# Patient Record
Sex: Female | Born: 1990 | Race: Black or African American | Hispanic: No | Marital: Married | Smoking: Never smoker
Health system: Southern US, Community
[De-identification: ages and names within clinical notes are randomized; demographics above are authoritative.]

## PROBLEM LIST (undated history)

## (undated) ENCOUNTER — Inpatient Hospital Stay (HOSPITAL_COMMUNITY): Payer: Self-pay

## (undated) DIAGNOSIS — D219 Benign neoplasm of connective and other soft tissue, unspecified: Secondary | ICD-10-CM

## (undated) HISTORY — DX: Benign neoplasm of connective and other soft tissue, unspecified: D21.9

## (undated) HISTORY — PX: NO PAST SURGERIES: SHX2092

---

## 2010-09-28 ENCOUNTER — Other Ambulatory Visit: Payer: Self-pay | Admitting: Internal Medicine

## 2010-09-28 ENCOUNTER — Ambulatory Visit
Admission: RE | Admit: 2010-09-28 | Discharge: 2010-09-28 | Disposition: A | Discharge status: Home / Self Care | Payer: No Typology Code available for payment source | Source: Ambulatory Visit | Attending: Internal Medicine | Admitting: Internal Medicine

## 2010-09-28 DIAGNOSIS — R7611 Nonspecific reaction to tuberculin skin test without active tuberculosis: Secondary | ICD-10-CM

## 2012-08-13 ENCOUNTER — Emergency Department (HOSPITAL_COMMUNITY)
Admission: EM | Admit: 2012-08-13 | Discharge: 2012-08-13 | Disposition: A | Discharge status: Home / Self Care | Payer: BC Managed Care – PPO | Attending: Emergency Medicine | Admitting: Emergency Medicine

## 2012-08-13 ENCOUNTER — Encounter (HOSPITAL_COMMUNITY): Payer: Self-pay | Admitting: Emergency Medicine

## 2012-08-13 DIAGNOSIS — R1013 Epigastric pain: Secondary | ICD-10-CM | POA: Insufficient documentation

## 2012-08-13 DIAGNOSIS — Z3202 Encounter for pregnancy test, result negative: Secondary | ICD-10-CM | POA: Insufficient documentation

## 2012-08-13 DIAGNOSIS — R109 Unspecified abdominal pain: Secondary | ICD-10-CM

## 2012-08-13 LAB — CBC WITH DIFFERENTIAL/PLATELET
Eosinophils Relative: 2 % (ref 0–5)
HCT: 34.6 % — ABNORMAL LOW (ref 36.0–46.0)
Hemoglobin: 11 g/dL — ABNORMAL LOW (ref 12.0–15.0)
Lymphocytes Relative: 35 % (ref 12–46)
Lymphs Abs: 3.4 10*3/uL (ref 0.7–4.0)
MCV: 72.8 fL — ABNORMAL LOW (ref 78.0–100.0)
Monocytes Absolute: 0.7 10*3/uL (ref 0.1–1.0)
RBC: 4.75 MIL/uL (ref 3.87–5.11)
WBC: 9.6 10*3/uL (ref 4.0–10.5)

## 2012-08-13 LAB — URINALYSIS, ROUTINE W REFLEX MICROSCOPIC
Glucose, UA: NEGATIVE mg/dL
Hgb urine dipstick: NEGATIVE
Protein, ur: NEGATIVE mg/dL
pH: 7.5 (ref 5.0–8.0)

## 2012-08-13 LAB — BASIC METABOLIC PANEL
CO2: 26 mEq/L (ref 19–32)
Calcium: 9.3 mg/dL (ref 8.4–10.5)
Creatinine, Ser: 0.79 mg/dL (ref 0.50–1.10)
Glucose, Bld: 87 mg/dL (ref 70–99)

## 2012-08-13 LAB — PREGNANCY, URINE: Preg Test, Ur: NEGATIVE

## 2012-08-13 MED ORDER — SODIUM CHLORIDE 0.9 % IV BOLUS (SEPSIS)
1000.0000 mL | Freq: Once | INTRAVENOUS | Status: AC
  Administered 2012-08-13: 1000 mL via INTRAVENOUS

## 2012-08-13 MED ORDER — FAMOTIDINE 20 MG PO TABS: 20.0000 mg | ORAL_TABLET | Freq: Two times a day (BID) | ORAL | Status: DC

## 2012-08-13 MED ORDER — GI COCKTAIL ~~LOC~~
30.0000 mL | Freq: Once | ORAL | Status: AC
  Administered 2012-08-13: 30 mL via ORAL
  Filled 2012-08-13: qty 30

## 2012-08-13 NOTE — ED Provider Notes (Signed)
History     CSN: [id number]  Arrival date & time 08/13/12  4098   First MD Initiated Contact with Patient 08/13/12 [phone]      Chief Complaint  Patient presents with  . Abdominal Pain    (Consider location/radiation/quality/duration/timing/severity/associated sxs/prior treatment) HPI Comments: Patient is a 22 year old female who presents today with epigastric abdominal pain that she describes as pressure and hard. The pain began after awakening after a nap yesterday around 4 PM. Around 3 PM she took diet pills. She states that the pain is not necessarily worsening, but it is not improving. She denies nausea, vomiting, diarrhea, constipation, fevers, sick contacts, suspicious food intake, recent travel. Her last bowel movement was last night. He has not had anything to make the pain better, although sometimes holding her stomach makes it feel better. The pain is nonradiating. No fevers, chills, shortness of breath, weakness, numbness. The history is provided by the patient. No language interpreter was used.    History reviewed. No pertinent past medical history.  History reviewed. No pertinent past surgical history.  History reviewed. No pertinent family history.  History  Substance Use Topics  . Smoking status: Never Smoker   . Smokeless tobacco: Not on file  . Alcohol Use: No    OB History   Grav Para Term Preterm Abortions TAB SAB Ect Mult Living                  Review of Systems  Constitutional: Negative for fever and chills.  Respiratory: Negative for shortness of breath.   Gastrointestinal: Positive for abdominal pain. Negative for nausea, vomiting and constipation.  All other systems reviewed and are negative.    Allergies  Review of patient's allergies indicates no known allergies.  Home Medications   Current Outpatient Rx  Name  Route  Sig  Dispense  Refill  . ibuprofen (ADVIL,MOTRIN) 200 MG tablet   Oral   Take 200 mg by mouth every 6 (six) hours as  needed for pain (pain  headaches).           BP 117/65  Pulse 69  Temp(Src) 98.4 F (36.9 C) (Oral)  Resp 19  SpO2 100%  LMP 08/06/2012  Physical Exam  Nursing note and vitals reviewed. Constitutional: She is oriented to person, place, and time. She appears well-developed and well-nourished. No distress.  HENT:  Head: Normocephalic and atraumatic.  Right Ear: External ear normal.  Left Ear: External ear normal.  Nose: Nose normal.  Mouth/Throat: Oropharynx is clear and moist.  Eyes: Conjunctivae are normal.  Neck: Normal range of motion.  Cardiovascular: Normal rate, regular rhythm and normal heart sounds.   Pulmonary/Chest: Effort normal and breath sounds normal. No stridor. No respiratory distress. She has no wheezes. She has no rales.  Abdominal: Soft. Normal appearance and bowel sounds are normal. She exhibits no distension. There is tenderness in the epigastric area. There is no rebound and no guarding.  Musculoskeletal: Normal range of motion.  Neurological: She is alert and oriented to person, place, and time. She has normal strength.  Skin: Skin is warm and dry. She is not diaphoretic. No erythema.  Psychiatric: She has a normal mood and affect. Her behavior is normal.    ED Course  Procedures (including critical care time)  Labs Reviewed  CBC WITH DIFFERENTIAL - Abnormal; Notable for the following:    Hemoglobin 11.0 (*)    HCT 34.6 (*)    MCV 72.8 (*)    MCH 23.2 (*)  All other components within normal limits  BASIC METABOLIC PANEL - Abnormal; Notable for the following:    Sodium 134 (*)    All other components within normal limits  URINALYSIS, ROUTINE W REFLEX MICROSCOPIC  PREGNANCY, URINE   No results found.  0900: Patient reexamined, soft, nontender to palpation in abdomen. She states she is feeling significantly improved.   1. Abdominal pain       MDM  Patient presents with epigastric abdominal pain approximately 1 hour after taking an  expired diet pill. No rebound or guarding. Labs WNL. Symptoms improved after GI cocktail. Abdomen soft. Afebrile. No concern for cholecystitis, pancreatitis, appendicitis. Case was discussed with Dr. Adriana Simas. Return instructions given. Vital signs stable for discharge. Patient / Family / Caregiver informed of clinical course, understand medical decision-making process, and agree with plan.         Mora Bellman, PA-C 08/13/12 [phone]

## 2012-08-13 NOTE — ED Notes (Signed)
From home with complaint of abdominal pain at 8/ 10 , denies N/V. Last BM this morning.

## 2012-08-13 NOTE — ED Provider Notes (Signed)
Medical screening  examination/treatment/procedure(s) were conducted as a shared visit with non-physician practitioner(s) and myself.  I personally evaluated the patient during the encounter.  No acute abdomen  Donnetta Hutching, MD 08/13/12 1424

## 2012-08-31 ENCOUNTER — Encounter: Payer: Self-pay | Admitting: Physician Assistant

## 2012-11-30 ENCOUNTER — Encounter: Payer: Self-pay | Admitting: Physician Assistant

## 2012-12-14 ENCOUNTER — Ambulatory Visit (INDEPENDENT_AMBULATORY_CARE_PROVIDER_SITE_OTHER): Payer: BC Managed Care – PPO | Admitting: Emergency Medicine

## 2012-12-14 ENCOUNTER — Ambulatory Visit: Payer: BC Managed Care – PPO

## 2012-12-14 VITALS — BP 108/64 | HR 82 | Temp 98.9°F | Resp 20 | Ht 63.0 in | Wt 198.6 lb

## 2012-12-14 DIAGNOSIS — S139XXA Sprain of joints and ligaments of unspecified parts of neck, initial encounter: Secondary | ICD-10-CM

## 2012-12-14 DIAGNOSIS — M25529 Pain in unspecified elbow: Secondary | ICD-10-CM

## 2012-12-14 DIAGNOSIS — M25522 Pain in left elbow: Secondary | ICD-10-CM

## 2012-12-14 DIAGNOSIS — M542 Cervicalgia: Secondary | ICD-10-CM

## 2012-12-14 DIAGNOSIS — S335XXA Sprain of ligaments of lumbar spine, initial encounter: Secondary | ICD-10-CM

## 2012-12-14 MED ORDER — NAPROXEN SODIUM 550 MG PO TABS: 550.0000 mg | ORAL_TABLET | Freq: Two times a day (BID) | ORAL | Status: DC

## 2012-12-14 MED ORDER — CYCLOBENZAPRINE HCL 10 MG PO TABS: 10.0000 mg | ORAL_TABLET | Freq: Three times a day (TID) | ORAL | Status: DC | PRN

## 2012-12-14 NOTE — Patient Instructions (Addendum)
Lumbosacral Strain Lumbosacral strain is one of the most common causes of back pain. There are many causes of back pain. Most are not serious conditions. CAUSES  Your backbone (spinal column) is made up of 24 main vertebral bodies, the sacrum, and the coccyx. These are held together by muscles and tough, fibrous tissue (ligaments). Nerve roots pass through the openings between the vertebrae. A sudden move or injury to the back may cause injury to, or pressure on, these nerves. This may result in localized back pain or pain movement (radiation) into the buttocks, down the leg, and into the foot. Sharp, shooting pain from the buttock down the back of the leg (sciatica) is frequently associated with a ruptured (herniated) disk. Pain may be caused by muscle spasm alone. Your caregiver can often find the cause of your pain by the details of your symptoms and an exam. In some cases, you may need tests (such as X-rays). Your caregiver will work with you to decide if any tests are needed based on your specific exam. HOME CARE INSTRUCTIONS   Avoid an underactive lifestyle. Active exercise, as directed by your caregiver, is your greatest weapon against back pain.  Avoid hard physical activities (tennis, racquetball, waterskiing) if you are not in proper physical condition for it. This may aggravate or create problems.  If you have a back problem, avoid sports requiring sudden body movements. Swimming and walking are generally safer activities.  Maintain good posture.  Avoid becoming overweight (obese).  Use bed rest for only the most extreme, sudden (acute) episode. Your caregiver will help you determine how much bed rest is necessary.  For acute conditions, you may put ice on the injured area.  Put ice in a plastic bag.  Place a towel between your skin and the bag.  Leave the ice on for 15-20 minutes at a time, every 2 hours, or as needed.  After you are improved and more active, it may help to  apply heat for 30 minutes before activities. See your caregiver if you are having pain that lasts longer than expected. Your caregiver can advise appropriate exercises or therapy if needed. With conditioning, most back problems can be avoided. SEEK IMMEDIATE MEDICAL CARE IF:   You have numbness, tingling, weakness, or problems with the use of your arms or legs.  You experience severe back pain not relieved with medicines.  There is a change in bowel or bladder control.  You have increasing pain in any area of the body, including your belly (abdomen).  You notice shortness of breath, dizziness, or feel faint.  You feel sick to your stomach (nauseous), are throwing up (vomiting), or become sweaty.  You notice discoloration of your toes or legs, or your feet get very cold.  Your back pain is getting worse.  You have a fever. MAKE SURE YOU:   Understand these instructions.  Will watch your condition.  Will get help right away if you are not doing well or get worse. Document Released: 12/30/2004 Document Revised: 06/14/2011 Document Reviewed: 06/21/2008 [facility] Patient Information 2014 [place], [place]. Cervical Sprain A cervical sprain is an injury in the neck in which the ligaments are stretched or torn. The ligaments are the tissues that hold the bones of the neck (vertebrae) in place.Cervical sprains can range from very mild to very severe. Most cervical sprains get better in 1 to 3 weeks, but it depends on the cause and extent of the injury. Severe cervical sprains can cause the neck vertebrae  to be unstable. This can lead to damage of the spinal cord and can result in serious nervous system problems. Your caregiver will determine whether your cervical sprain is mild or severe. CAUSES  Severe cervical sprains may be caused by:  Contact sport injuries (football, rugby, wrestling, hockey, auto racing, gymnastics, diving, martial arts, boxing).  Motor vehicle  collisions.  Whiplash injuries. This means the neck is forcefully whipped backward and forward.  Falls. Mild cervical sprains may be caused by:   Awkward positions, such as cradling a telephone between your ear and shoulder.  Sitting in a chair that does not offer proper support.  Working at a poorly Marketing executive station.  Activities that require looking up or down for long periods of time. SYMPTOMS   Pain, soreness, stiffness, or a burning sensation in the front, back, or sides of the neck. This discomfort may develop immediately after injury or it may develop slowly and not begin for 24 hours or more after an injury.  Pain or tenderness directly in the middle of the back of the neck.  Shoulder or upper back pain.  Limited ability to move the neck.  Headache.  Dizziness.  Weakness, numbness, or tingling in the hands or arms.  Muscle spasms.  Difficulty swallowing or chewing.  Tenderness and swelling of the neck. DIAGNOSIS  Most of the time, your caregiver can diagnose this problem by taking your history and doing a physical exam. Your caregiver will ask about any known problems, such as arthritis in the neck or a previous neck injury. X-rays may be taken to find out if there are any other problems, such as problems with the bones of the neck. However, an X-ray often does not reveal the full extent of a cervical sprain. Other tests such as a computed tomography (CT) scan or magnetic resonance imaging (MRI) may be needed. TREATMENT  Treatment depends on the severity of the cervical sprain. Mild sprains can be treated with rest, keeping the neck in place (immobilization), and pain medicines. Severe cervical sprains need immediate immobilization and an appointment with an orthopedist or neurosurgeon. Several treatment options are available to help with pain, muscle spasms, and other symptoms. Your caregiver may prescribe:  Medicines, such as pain relievers, numbing  medicines, or muscle relaxants.  Physical therapy. This can include stretching exercises, strengthening exercises, and posture training. Exercises and improved posture can help stabilize the neck, strengthen muscles, and help stop symptoms from returning.  A neck collar to be worn for short periods of time. Often, these collars are worn for comfort. However, certain collars may be worn to protect the neck and prevent further worsening of a serious cervical sprain. HOME CARE INSTRUCTIONS   Put ice on the injured area.  Put ice in a plastic bag.  Place a towel between your skin and the bag.  Leave the ice on for 15-20 minutes, 3-4 times a day.  Only take over-the-counter or prescription medicines for pain, discomfort, or fever as directed by your caregiver.  Keep all follow-up appointments as directed by your caregiver.  Keep all physical therapy appointments as directed by your caregiver.  If a neck collar is prescribed, wear it as directed by your caregiver.  Do not drive while wearing a neck collar.  Make any needed adjustments to your work station to promote good posture.  Avoid positions and activities that make your symptoms worse.  Warm up and stretch before being active to help prevent problems. SEEK MEDICAL CARE IF:  Your pain is not controlled with medicine.  You are unable to decrease your pain medicine over time as planned.  Your activity level is not improving as expected. SEEK IMMEDIATE MEDICAL CARE IF:   You develop any bleeding, stomach upset, or signs of an allergic reaction to your medicine.  Your symptoms get worse.  You develop new, unexplained symptoms.  You have numbness, tingling, weakness, or paralysis in any part of your body. MAKE SURE YOU:   Understand these instructions.  Will watch your condition.  Will get help right away if you are not doing well or get worse. Document Released: 01/17/2007 Document Revised: 06/14/2011 Document  Reviewed: 12/23/2010 [facility] Patient Information 2014 [place], [place].
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
## 2013-03-10 ENCOUNTER — Ambulatory Visit (INDEPENDENT_AMBULATORY_CARE_PROVIDER_SITE_OTHER): Payer: BC Managed Care – PPO | Admitting: Physician Assistant

## 2013-03-10 VITALS — BP 104/60 | HR 91 | Temp 99.9°F | Resp 16 | Ht 61.5 in | Wt 206.0 lb

## 2013-03-10 DIAGNOSIS — Z23 Encounter for immunization: Secondary | ICD-10-CM

## 2013-03-10 DIAGNOSIS — J111 Influenza due to unidentified influenza virus with other respiratory manifestations: Secondary | ICD-10-CM

## 2013-03-10 DIAGNOSIS — R509 Fever, unspecified: Secondary | ICD-10-CM

## 2013-03-10 DIAGNOSIS — J101 Influenza due to other identified influenza virus with other respiratory manifestations: Secondary | ICD-10-CM

## 2013-03-10 LAB — POCT INFLUENZA A/B
Influenza A, POC: POSITIVE
Influenza B, POC: NEGATIVE

## 2013-03-10 MED ORDER — TYPHOID VACCINE PO CPDR: 1.0000 | DELAYED_RELEASE_CAPSULE | ORAL | Status: DC

## 2013-03-10 MED ORDER — DOXYCYCLINE HYCLATE 100 MG PO CAPS: ORAL_CAPSULE | ORAL | Status: DC

## 2013-03-10 MED ORDER — OSELTAMIVIR PHOSPHATE 75 MG PO CAPS: 75.0000 mg | ORAL_CAPSULE | Freq: Two times a day (BID) | ORAL | Status: DC

## 2013-03-10 NOTE — Progress Notes (Signed)
   Subjective:    Patient ID: Danielle Cisneros, female    DOB: 1990/04/12, 22 y.o.   MRN: [id number]  Chief Complaint  Patient presents with  . Headache    X yesterday  . Cough    X yesterday  . Sore Throat    X yesterday  . Congested    Sinus and Chest, X yesterday  . Fever    X yesterday   Medications, allergies, past medical history, surgical history, family history, social history and problem list reviewed and updated.  HPI Sudden onset of the above symptoms yesterday. No diarrhea, nausea or vomiting. Fever "really high." Aches all over.  Did not receive the flu vaccine this season.  Also, she and her father leave for [place] next week and need malaria and typhoid vaccination.  It's not clear if they have received yellow fever vaccination, though I suspect they have for previous travel.  They did not bring their vaccine records with them today.  Review of Systems As above.    Objective:   Physical Exam  Blood pressure 104/60, pulse 91, temperature 99.9 F (37.7 C), temperature source Oral, resp. rate 16, height 5' 1.5" (1.562 m), weight 206 lb (93.441 kg), last menstrual period 02/17/2013, SpO2 100.00%. Body mass index is 38.3 kg/(m^2). Well-developed, well nourished BF who is awake, alert and oriented, who is obviously ill. HEENT: [place]/AT, PERRL, EOMI.  Sclera and conjunctiva are clear.  EAC are patent, TMs are normal in appearance. Nasal mucosa is congested, pink and moist. OP is clear. Neck: supple, non-tender, no lymphadenopathy, thyromegaly. Heart: RRR, no murmur Lungs: normal effort, CTA; she coughs repeatedly during interview and exam. Abdomen: normo-active bowel sounds, supple, non-tender, no mass or organomegaly. Extremities: no cyanosis, clubbing or edema. Skin: warm and dry without rash. Psychologic: good mood and appropriate affect, normal speech and behavior.  Results for orders placed in visit on 03/10/13  POCT INFLUENZA A/B      Result Value Range   Influenza A, POC Positive     Influenza B, POC Negative          Assessment & Plan:  1. Fever - POCT Influenza A/B  2. Influenza A Anticipatory guidance provided.  Supportive care. - oseltamivir (TAMIFLU) 75 MG capsule; Take 1 capsule (75 mg total) by mouth 2 (two) times daily.  Dispense: 10 capsule; Refill: 0  3. Need for malaria vaccination - doxycycline (VIBRAMYCIN) 100 MG capsule; Take one daily. Begin 1-2 days prior to travel and continue for 30 days after return  Dispense: 60 capsule; Refill: 0  4. Need for meningococcal vaccination Wait until afebrile - Meningococcal conjugate vaccine 4-valent IM; Future  5. Need for immunization against typhoid She'll wait to start until she is afebrile and completes the Tamiflu - typhoid (VIVOTIF) DR capsule; Take 1 capsule by mouth every other day. X 4 doses. Complete at least one week prior to travel.  Dispense: 4 capsule; Refill: 0

## 2013-03-10 NOTE — Patient Instructions (Addendum)
Return before travel for meningitis vaccine   Influenza, Adult Influenza ("the flu") is a viral infection of the respiratory tract. It occurs more often in winter months because people spend more time in close contact with one another. Influenza can make you feel very sick. Influenza easily spreads from person to person (contagious). CAUSES  Influenza is caused by a virus that infects the respiratory tract. You can catch the virus by breathing in droplets from an infected person's cough or sneeze. You can also catch the virus by touching something that was recently contaminated with the virus and then touching your mouth, nose, or eyes. SYMPTOMS  Symptoms typically last 4 to 10 days and may include:  Fever.  Chills.  Headache, body aches, and muscle aches.  Sore throat.  Chest discomfort and cough.  Poor appetite.  Weakness or feeling tired.  Dizziness.  Nausea or vomiting. DIAGNOSIS  Diagnosis of influenza is often made based on your history and a physical exam. A nose or throat swab test can be done to confirm the diagnosis. RISKS AND COMPLICATIONS You may be at risk for a more severe case of influenza if you smoke cigarettes, have diabetes, have chronic heart disease (such as heart failure) or lung disease (such as asthma), or if you have a weakened immune system. Elderly people and pregnant women are also at risk for more serious infections. The most common complication of influenza is a lung infection (pneumonia). Sometimes, this complication can require emergency medical care and may be life-threatening. PREVENTION  An annual influenza vaccination (flu shot) is the best way to avoid getting influenza. An annual flu shot is now routinely recommended for all adults in the [place] TREATMENT  In mild cases, influenza goes away on its own. Treatment is directed at relieving symptoms. For more severe cases, your caregiver may prescribe antiviral medicines to shorten the sickness.  Antibiotic medicines are not effective, because the infection is caused by a virus, not by bacteria. HOME CARE INSTRUCTIONS  Only take over-the-counter or prescription medicines for pain, discomfort, or fever as directed by your caregiver.  Use a cool mist humidifier to make breathing easier.  Get plenty of rest until your temperature returns to normal. This usually takes 3 to 4 days.  Drink enough fluids to keep your urine clear or pale yellow.  Cover your mouth and nose when coughing or sneezing, and wash your hands well to avoid spreading the virus.  Stay home from work or school until your fever has been gone for at least 1 full day. SEEK MEDICAL CARE IF:   You have chest pain or a deep cough that worsens or produces more mucus.  You have nausea, vomiting, or diarrhea. SEEK IMMEDIATE MEDICAL CARE IF:   You have difficulty breathing, shortness of breath, or your skin or nails turn bluish.  You have severe neck pain or stiffness.  You have a severe headache, facial pain, or earache.  You have a worsening or recurring fever.  You have nausea or vomiting that cannot be controlled. MAKE SURE YOU:  Understand these instructions.  Will watch your condition.  Will get help right away if you are not doing well or get worse. Document Released: 03/19/2000 Document Revised: 09/21/2011 Document Reviewed: 06/21/2011 [facility] Patient Information 2014 [place], [place].

## 2013-06-17 ENCOUNTER — Ambulatory Visit: Payer: BC Managed Care – PPO

## 2013-06-17 ENCOUNTER — Ambulatory Visit (INDEPENDENT_AMBULATORY_CARE_PROVIDER_SITE_OTHER): Payer: BC Managed Care – PPO | Admitting: Family Medicine

## 2013-06-17 VITALS — BP 108/68 | HR 73 | Temp 99.3°F | Resp 16 | Ht 61.25 in | Wt 216.6 lb

## 2013-06-17 DIAGNOSIS — R9431 Abnormal electrocardiogram [ECG] [EKG]: Secondary | ICD-10-CM

## 2013-06-17 DIAGNOSIS — E669 Obesity, unspecified: Secondary | ICD-10-CM

## 2013-06-17 DIAGNOSIS — R079 Chest pain, unspecified: Secondary | ICD-10-CM

## 2013-06-17 DIAGNOSIS — D1779 Benign lipomatous neoplasm of other sites: Secondary | ICD-10-CM

## 2013-06-17 DIAGNOSIS — D171 Benign lipomatous neoplasm of skin and subcutaneous tissue of trunk: Secondary | ICD-10-CM

## 2013-06-17 MED ORDER — CYCLOBENZAPRINE HCL 10 MG PO TABS: 10.0000 mg | ORAL_TABLET | Freq: Two times a day (BID) | ORAL | Status: DC | PRN

## 2013-06-17 NOTE — Progress Notes (Signed)
[facility] [address], [place] [address] [phone]  Date:  06/17/2013   Name:  Danielle Cisneros   DOB:  1990/10/30   MRN:  [id number]  PCP:  No PCP Per Patient    Chief Complaint: Chest Pain   History of Present Illness:  Danielle Cisneros is a 23 y.o. very pleasant female patient who presents with the following:  She is here today with CP for the last 3 days- itstarted out more subtle but is more "stabbing" now.  She feels it under her right breast.  She feels it when she twists her torso or takes a deep breath.    She has never had this in the past.  She did recently travel to [place] and carried some heavy luggage; she did this just a few days ago and thinks it may have triggered the pain.  Admits she is not generally physically active or used to doing any lifting   While sitting quietly at rest she does not have any pain She does not have a cough, fever, ST, nausea or vomiting.   She will notice shortness if she tries to take a deep breath.    She does not have a pesonal or family history of heart problems. She is a non- smoker.  She tries some alkaTechnical brewer.   She has pain with changing position and laying on her right side.   No post- prandial pain.   There are no active problems to display for this patient.   History reviewed. No pertinent past medical history.  History reviewed. No pertinent past surgical history.  History  Substance Use Topics  . Smoking status: Never Smoker   . Smokeless tobacco: Never Used  . Alcohol Use: No    History reviewed. No pertinent family history.  No Known Allergies  Medication list has been reviewed and updated.  Current Outpatient Prescriptions on File Prior to Visit  Medication Sig Dispense Refill  . doxycycline (VIBRAMYCIN) 100 MG capsule Take one daily. Begin 1-2 days prior to travel and continue for 30 days after return  60 capsule  0  . oseltamivir (TAMIFLU) 75 MG capsule Take 1 capsule (75 mg  total) by mouth 2 (two) times daily.  10 capsule  0  . typhoid (VIVOTIF) DR capsule Take 1 capsule by mouth every other day. X 4 doses. Complete at least one week prior to travel.  4 capsule  0   No current facility-administered medications on file prior to visit.    Review of Systems:  As per HPI- otherwise negative.   Physical Examination: Filed Vitals:   06/17/13 1004  BP: 108/68  Pulse: 73  Temp: 99.3 F (37.4 C)  Resp: 16   Filed Vitals:   06/17/13 1004  Height: 5' 1.25" (1.556 m)  Weight: 216 lb 9.6 oz (98.249 kg)   Body mass index is 40.58 kg/(m^2). Ideal Body Weight: Weight in (lb) to have BMI = 25: 133.1  GEN: WDWN, NAD, Non-toxic, A & O x 3, looks well, obese HEENT: Atraumatic, Normocephalic. Neck supple. No masses, No LAD.  Bilateral TM wnl, oropharynx normal.  PEERL,EOMI.   Ears and Nose: No external deformity. CV: RRR, No M/G/R. No JVD. No thrill. No extra heart sounds. She has obviously reproducible tenderness over her right lower ribs and chest wall.  No abdominal pain.  No rash or other skin lesion PULM: CTA B, no wheezes, crackles, rhonchi. No retractions. No resp. distress. No accessory muscle  use. ABD: S, NT, ND, +BS. No rebound. No HSM. EXTR: No c/c/e NEURO Normal gait.  PSYCH: Normally interactive. Conversant. Not depressed or anxious appearing.  Calm demeanor.  She also has a soft tissue mass on her right upper back.  States this has been present for a few years. She has been told it was likely a lipoma, but has never had it formally evaluated or imaged.    EKG: no ST elevation or depression, but she does have increased voltage positive for LVH.   [facility] reading (PRIMARY) by  Dr. Lorelei Pont. CXR: negative  CHEST 2 VIEW  COMPARISON: 09/28/2010  FINDINGS: There is borderline cardiomegaly. Pulmonary vascularity is normal. Lungs are clear. Slight thoracic scoliosis, unchanged.  IMPRESSION: New borderline cardiomegaly.  Assessment and Plan: Chest  pain - Plan: DG Chest 2 View, EKG 12-Lead, cyclobenzaprine (FLEXERIL) 10 MG tablet  Lipoma of back - Plan: [place] Misc Soft Tissue  Nonspecific abnormal electrocardiogram (ECG) (EKG) - Plan: 2D Echocardiogram without contrast  MSK chest pain likely due to carrying heavy luggage in a person who is deconditioned. She also has possible LVH. Discussed the fact that I cannot totally rule- out cardiac origin of pain in clinic- I am glad to arrange an ED evaluation.  She declines this for now.  We will set up an echo asap, and also will let me know if she develops any post- prandial pain to suggest a gallbladder issue.  She will seek care if getting worse in any way.    For now she may use flexeril as needed for pain  Signed Lamar Blinks, MD

## 2013-06-17 NOTE — Patient Instructions (Addendum)
It appear that you chest pain is due to a muscle strain.  Please use tylenol, and your muscle relaxer as needed.  Remember the muscle relaxer will make you sleepy, so do not use when you need to drive.    I will arrange an ultrasound to look at the area on your back so we can confirm it is a lipoma (a benign fatty tumor)  Also, we will contact you about an echocardiogram- this [place] an ultrasound to look at your heart function.  Your EKG shows that you may have some enlargement of your heart muscle.    Once you are feeling better, please do work on becoming more active an on losing weight.  Your weight and lack of exercise may put you at risk for diabetes and high blood pressure in the future.

## 2013-06-18 ENCOUNTER — Other Ambulatory Visit: Payer: Self-pay | Admitting: Radiology

## 2013-06-18 ENCOUNTER — Telehealth: Payer: Self-pay | Admitting: Radiology

## 2013-06-18 DIAGNOSIS — M7989 Other specified soft tissue disorders: Secondary | ICD-10-CM

## 2013-06-18 NOTE — Telephone Encounter (Signed)
Spoke with her- she can make this appt.  Gave her the phone number and address, she will plan to be there about 1:30 tomorrow.  Overall she is feeling better, her pain is much reduced.  The flexeril helps

## 2013-06-18 NOTE — Telephone Encounter (Signed)
Called [facility]. They can do the Echo tomorrow at 2pm. Left message for patient to call me back so I can advise.

## 2013-06-19 ENCOUNTER — Other Ambulatory Visit (HOSPITAL_COMMUNITY): Payer: Self-pay | Admitting: Family Medicine

## 2013-06-19 ENCOUNTER — Ambulatory Visit (HOSPITAL_COMMUNITY): Payer: BC Managed Care – PPO | Attending: Family Medicine | Admitting: Cardiology

## 2013-06-19 DIAGNOSIS — R9431 Abnormal electrocardiogram [ECG] [EKG]: Secondary | ICD-10-CM | POA: Insufficient documentation

## 2013-06-19 DIAGNOSIS — R079 Chest pain, unspecified: Secondary | ICD-10-CM

## 2013-06-19 NOTE — Progress Notes (Signed)
Echo performed. 

## 2013-06-22 ENCOUNTER — Encounter: Payer: Self-pay | Admitting: Family Medicine

## 2013-06-22 ENCOUNTER — Telehealth: Payer: Self-pay | Admitting: *Deleted

## 2013-06-22 NOTE — Telephone Encounter (Signed)
Left message on vm letting pt know that we will be calling to schedule [place]

## 2013-06-26 ENCOUNTER — Ambulatory Visit
Admission: RE | Admit: 2013-06-26 | Discharge: 2013-06-26 | Disposition: A | Discharge status: Home / Self Care | Payer: BC Managed Care – PPO | Source: Ambulatory Visit | Attending: Family Medicine | Admitting: Family Medicine

## 2013-06-26 DIAGNOSIS — M7989 Other specified soft tissue disorders: Secondary | ICD-10-CM

## 2013-06-28 ENCOUNTER — Encounter: Payer: Self-pay | Admitting: Family Medicine

## 2015-02-17 ENCOUNTER — Encounter (HOSPITAL_COMMUNITY): Payer: Self-pay | Admitting: *Deleted

## 2015-02-17 ENCOUNTER — Emergency Department (HOSPITAL_COMMUNITY): Payer: No Typology Code available for payment source

## 2015-02-17 ENCOUNTER — Emergency Department (HOSPITAL_COMMUNITY)
Admission: EM | Admit: 2015-02-17 | Discharge: 2015-02-17 | Disposition: A | Discharge status: Home / Self Care | Payer: No Typology Code available for payment source | Attending: Emergency Medicine | Admitting: Emergency Medicine

## 2015-02-17 DIAGNOSIS — Y92410 Unspecified street and highway as the place of occurrence of the external cause: Secondary | ICD-10-CM | POA: Diagnosis not present

## 2015-02-17 DIAGNOSIS — S299XXA Unspecified injury of thorax, initial encounter: Secondary | ICD-10-CM | POA: Insufficient documentation

## 2015-02-17 DIAGNOSIS — Y9389 Activity, other specified: Secondary | ICD-10-CM | POA: Insufficient documentation

## 2015-02-17 DIAGNOSIS — S199XXA Unspecified injury of neck, initial encounter: Secondary | ICD-10-CM | POA: Diagnosis not present

## 2015-02-17 DIAGNOSIS — Y998 Other external cause status: Secondary | ICD-10-CM | POA: Diagnosis not present

## 2015-02-17 DIAGNOSIS — R079 Chest pain, unspecified: Secondary | ICD-10-CM

## 2015-02-17 DIAGNOSIS — S6992XA Unspecified injury of left wrist, hand and finger(s), initial encounter: Secondary | ICD-10-CM | POA: Diagnosis not present

## 2015-02-17 MED ORDER — MELOXICAM 15 MG PO TABS
15.0000 mg | ORAL_TABLET | Freq: Every day | ORAL | Status: DC
Start: 2015-02-17 — End: 2015-02-17

## 2015-02-17 MED ORDER — MELOXICAM 15 MG PO TABS: 15.0000 mg | ORAL_TABLET | Freq: Every day | ORAL | Status: DC

## 2015-02-17 MED ORDER — CYCLOBENZAPRINE HCL 10 MG PO TABS
10.0000 mg | ORAL_TABLET | Freq: Two times a day (BID) | ORAL | Status: DC | PRN
Start: 2015-02-17 — End: 2015-02-22
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
## 2015-02-17 NOTE — Discharge Instructions (Signed)
Take flexeril as needed for muscle spasm. Refer to attached documents for more information. Refer to attached documents for more information.

## 2015-02-22 ENCOUNTER — Emergency Department (HOSPITAL_COMMUNITY): Payer: No Typology Code available for payment source

## 2015-02-22 ENCOUNTER — Emergency Department (HOSPITAL_COMMUNITY)
Admission: EM | Admit: 2015-02-22 | Discharge: 2015-02-22 | Disposition: A | Discharge status: Home / Self Care | Payer: No Typology Code available for payment source | Attending: Emergency Medicine | Admitting: Emergency Medicine

## 2015-02-22 ENCOUNTER — Encounter (HOSPITAL_COMMUNITY): Payer: Self-pay

## 2015-02-22 DIAGNOSIS — R0789 Other chest pain: Secondary | ICD-10-CM

## 2015-02-22 DIAGNOSIS — S299XXD Unspecified injury of thorax, subsequent encounter: Secondary | ICD-10-CM | POA: Insufficient documentation

## 2015-02-22 DIAGNOSIS — Z791 Long term (current) use of non-steroidal anti-inflammatories (NSAID): Secondary | ICD-10-CM | POA: Diagnosis not present

## 2015-02-22 MED ORDER — METHOCARBAMOL 500 MG PO TABS: 500.0000 mg | ORAL_TABLET | Freq: Three times a day (TID) | ORAL | Status: DC | PRN
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
## 2015-02-22 NOTE — Discharge Instructions (Signed)
Read the information below.  Use the prescribed medication as directed.  Please discuss all new medications with your pharmacist.  You may return to the Emergency Department at any time for worsening condition or any new symptoms that concern you.    You have been diagnosed by your caregiver as having chest wall pain. SEEK IMMEDIATE MEDICAL ATTENTION IF: You develop a fever.  Your chest pains become severe or intolerable.  You develop new, unexplained symptoms (problems).  You develop shortness of breath, nausea, vomiting, sweating or feel light headed.  You develop a new cough or you cough up blood.   Chest Wall Pain Chest wall pain is pain in or around the bones and muscles of your chest. Sometimes, an injury causes this pain. Sometimes, the cause may not be known. This pain may take several weeks or longer to get better. HOME CARE INSTRUCTIONS  Pay attention to any changes in your symptoms. Take these actions to help with your pain:   Rest as told by your health care provider.   Avoid activities that cause pain. These include any activities that use your chest muscles or your abdominal and side muscles to lift heavy items.   If directed, apply ice to the painful area:  Put ice in a plastic bag.  Place a towel between your skin and the bag.  Leave the ice on for 20 minutes, 2-3 times per day.  Take over-the-counter and prescription medicines only as told by your health care provider.  Do not use tobacco products, including cigarettes, chewing tobacco, and e-cigarettes. If you need help quitting, ask your health care provider.  Keep all follow-up visits as told by your health care provider. This is important. SEEK MEDICAL CARE IF:  You have a fever.  Your chest pain becomes worse.  You have new symptoms. SEEK IMMEDIATE MEDICAL CARE IF:  You have nausea or vomiting.  You feel sweaty or light-headed.  You have a cough with phlegm (sputum) or you cough up blood.  You  develop shortness of breath.   This information is not intended to replace advice given to you by your health care provider. Make sure you discuss any questions you have with your health care provider.   Document Released: 03/22/2005 Document Revised: 12/11/2014 Document Reviewed: 06/17/2014 Elsevier Interactive Patient Education 2016 [employer].   Emergency Department Resource Guide 1) Find a Doctor and Pay Out of Pocket Although you won't have to find out who is covered by your insurance plan, it is a good idea to ask around and get recommendations. You will then need to call the office and see if the doctor you have chosen will accept you as a new patient and what types of options they offer for patients who are self-pay. Some doctors offer discounts or will set up payment plans for their patients who do not have insurance, but you will need to ask so you aren't surprised when you get to your appointment.  2) Contact Your Local Health Department Not all health departments have doctors that can see patients for sick visits, but many do, so it is worth a call to see if yours does. If you don't know where your local health department is, you can check in your phone book. The CDC also has a tool to help you locate your state's health department, and many state websites also have listings of all of their local health departments.  3) Find a [facility] If your illness is not likely  to be very severe or complicated, you may want to try a walk in clinic. These are popping up all over the country in pharmacies, drugstores, and shopping centers. They're usually staffed by nurse practitioners or physician assistants that have been trained to treat common illnesses and complaints. They're usually fairly quick and inexpensive. However, if you have serious medical issues or chronic medical problems, these are probably not your best option.  No Primary Care Doctor: - Call Health Connect at  [phone] -  they can help you locate a primary care doctor that  accepts your insurance, provides certain services, etc. - Physician Referral Service- [phone]  Chronic Pain Problems: Organization         Address  Phone   Notes  [facility]  [phone] Patients need to be referred by their primary care doctor.   Medication Assistance: Organization         Address  Phone   Notes  [facility] Medication [facility] [place], [place] [address] [phone] --Must be a resident of [facility] -- Must have NO insurance coverage whatsoever (no Medicaid/ Medicare, etc.) -- The pt. MUST have a primary care doctor that directs their care regularly and follows them in the community   MedAssist  [phone]   [employer]  [phone]    Agencies that provide inexpensive medical care: Organization         Address  Phone   Notes  [place]  [phone]   [facility]    [phone]   [facility] [place], [place] [address] [phone]   [place] [address] [place] [phone]   Planned Parenthood    [phone]   [facility]    [phone]   [place] and [place] [address], [place] Phone:  [phone], Fax:  [phone] Hours of Operation:  9 am - 6 pm, M-F.  Also accepts Medicaid/Medicare and self-pay.  [facility] for [place], [address], [place] Phone: [phone], Fax: [phone]. Hours of Operation:  8:30 am - 5:30 pm, M-F.  Also accepts Medicaid and self-pay.  [facility] [address] [place] Phone: [phone]   [place], [place], [place] [phone], Ext. [phone] Mondays & Thursdays: 7-9 AM.  First 15 patients are seen on a first come, first serve basis.    [place] Providers:  Organization         Address  Phone   Notes  [facility] [address], [place] [phone] Also accepts self-pay patients.  [facility] [id number] [place], [place]  [phone]   [place], [address], [place] [phone]   [facility] [address] [place] [phone]   Lucianne Lei [address], [place]   [phone] Only accepts [place] Access [place] patients after they have their name applied to their card.   Self-Pay (no insurance) in [facility]:  Organization         Address  Phone   Notes  Sickle Cell Patients, [facility] [place] [phone]   [facility]  Urgent [facility] [place] [phone]   [facility] [place]  [place], [place], [place] [phone]   [facility]/Dr. Osei-Bonsu  [address], [place] or [address], [place] [phone] Phone number for both [place] and [place] locations is the same.  [facility] [address] [place] [phone]   [facility] [address] [place] or [address] [phone]   [facility] [address], [place] [phone], phone; [phone], fax Sees patients 1st and 3rd Saturday of every month.  Must not qualify for public or private insurance (i.e. Medicaid, Medicare, [place] Health Choice, Veterans' Benefits)  Household income should be no more than 200% of the poverty level The clinic cannot treat you if you are pregnant or think you are pregnant  Sexually transmitted diseases are not treated at the clinic.    Dental Care: Organization         Address  Phone  Notes  [facility] Department of [facility] [place] [phone] Accepts children up to age 75 who are enrolled in [place] or [place]; pregnant women with a Medicaid card; and children who have applied for Medicaid or [place] Health Choice, but were declined, whose parents can pay a reduced fee at time of service.  [facility] Department of [facility]  [address], [place] [phone] Accepts children up to age 2 who are enrolled in [place] or [facility] [facility]; pregnant women with a Medicaid card; and children who have applied for Medicaid or  Health Choice, but were declined, whose parents can pay a reduced fee at time of service.  [place] Adult Dental Access PROGRAM  [place] [phone] Patients are seen by appointment only. Walk-ins are not accepted. [place] will see patients 58 years of age and older. Monday - Tuesday (8am-5pm) Most Wednesdays (8:30-5pm) $30 per visit, cash only  [facility] Adult Dental Access PROGRAM  [address], [facility] [phone] Patients are seen by appointment only. Walk-ins are not accepted. [place] will see patients 1 years of age and older. One Wednesday Evening (Monthly: Volunteer Based).  $30 per visit, cash only  [place]  [phone] for adults; Children under age 71, call Graduate Pediatric Dentistry at [phone]. Children aged 61-14, please call [phone] to request a pediatric application.  Dental services are provided in all areas of dental care including fillings, crowns and bridges, complete and partial dentures, implants, gum treatment, root canals, and extractions. Preventive care is also provided. Treatment is provided to both adults and children. Patients are selected via a lottery and there is often a waiting list.   [facility] [address] [place]  [phone] [email or web address]   Rescue  [facility] [address] [place], [place] [phone], Ext. [phone] Second and Fourth Thursday of each month, opens at 6:30 AM; Clinic ends at 9 AM.  Patients are seen on a first-come first-served basis, and a limited number are seen during each clinic.   [facility]  [address] Hillard Danker [place], [place] [phone]   Eligibility Requirements You must have lived in [place], [place], or [place] counties for at least the last three months.  You cannot be eligible for state or federal sponsored [employer], including [employer], [place], or [employer].   You generally cannot be eligible for healthcare insurance through your employer.    How to apply: Eligibility screenings are held every Tuesday and Wednesday afternoon from 1:00 pm until 4:00 pm. You do not need an appointment for the interview!  [facility] [address], [place], [place]   [place]  [facility] Department  [place]  [phone]    Behavioral Health Resources in the Community: Intensive Outpatient Programs Organization         Address  Phone  Notes  [place]. [address] [place], [place] [phone]   [facility] [address], [place], [place]   ADS: Alcohol & Drug Svcs [address], [place], [place]   [place] [address],  [place], [place] or [phone]   Substance Abuse Resources Organization         Address  Phone  Notes  Alcohol and Drug Services  [phone]   [place]  [phone]   The [place]   Chinita Pester  [phone]   Residential & Outpatient Substance Abuse Program  [phone]   Psychological Services Organization         Address  Phone  Notes  [facility]   [place]  [phone]   [place] [address], [place] or [phone]    Mobile Crisis Teams Organization         Address  Phone  Notes  [facility]rapeutic [facility]atives, Mobile Crisis Care Unit  [phone]   [facility]ive Psychotherapeutic Services  [address] [place], [place]   [place] [address], [place] [phone]    Self-Help/Support Groups Organization         Address  Phone             Notes  [place]. of [place] - variety of support groups  [place] Call for more information  Narcotics Anonymous (NA), [facility] [address], [employer] [place]  2 meetings at this location   Special educational needs teacher         Address  Phone  Notes  ASAP Residential Treatment [place],    [place]  [phone]   [facility]  [address], [place] [id number], [place], [place]   [place], [place] [phone] Admissions: 8am-3pm M-F  Incentives Substance [place] [address],    [place], [place] [phone]   [facility] [address] [place], [place], [place]   [facility] [address]  [place], [place]   [facility]ight Programs - Intensive Outpatient [address] Kristeen [address], [place], [place]   [facility] [place].,  [place], [place] [phone] or [phone]   [facility]idential Treatment Services (RTS) [address], [place], [place] Accepts Medicaid  [facility] [place] [address],  [place] [phone] Substance Abuse/Addiction Treatment   [facility] Organization         Address  Phone  Notes  [facility]  [phone]   Domenic Schwab, PhD [place],  [place], [place]   [phone] or  [phone]   [facility]   [address] [place], [place] [phone]   [address], [place], [place] [phone] Insurance/Medicaid/sponsorship through [facility] and Families [address]                                    [place], [place] [phone] [place], [place] [phone]    Dr. Adele Schilder  [phone]   [facility] Dept. 1) [address] [place] 2) [place] 3)  [place] [address], [place] [phone]  [phone]   [place] [phone] or [phone] (After Hours)

## 2015-07-14 ENCOUNTER — Ambulatory Visit (INDEPENDENT_AMBULATORY_CARE_PROVIDER_SITE_OTHER): Payer: BLUE CROSS/BLUE SHIELD | Admitting: Physician Assistant

## 2015-07-14 VITALS — BP 110/80 | HR 79 | Temp 98.5°F | Resp 16 | Ht 62.0 in | Wt 227.0 lb

## 2015-07-14 DIAGNOSIS — G43909 Migraine, unspecified, not intractable, without status migrainosus: Secondary | ICD-10-CM

## 2015-07-14 DIAGNOSIS — Z0000 Encounter for general adult medical examination without abnormal findings: Secondary | ICD-10-CM

## 2015-07-14 DIAGNOSIS — Z13220 Encounter for screening for lipoid disorders: Secondary | ICD-10-CM | POA: Diagnosis not present

## 2015-07-14 DIAGNOSIS — Z319 Encounter for procreative management, unspecified: Secondary | ICD-10-CM | POA: Diagnosis not present

## 2015-07-14 DIAGNOSIS — Z124 Encounter for screening for malignant neoplasm of cervix: Secondary | ICD-10-CM | POA: Diagnosis not present

## 2015-07-14 DIAGNOSIS — Z114 Encounter for screening for human immunodeficiency virus [HIV]: Secondary | ICD-10-CM | POA: Diagnosis not present

## 2015-07-14 LAB — POCT URINE PREGNANCY: PREG TEST UR: NEGATIVE

## 2015-07-14 MED ORDER — IBUPROFEN 800 MG PO TABS: 800.0000 mg | ORAL_TABLET | Freq: Three times a day (TID) | ORAL | Status: DC | PRN

## 2015-07-14 NOTE — Progress Notes (Signed)
[facility] [address], [place] [address] [phone]  Date:  07/14/2015   Name:  Danielle Cisneros   DOB:  [birth year]   MRN:  [id number]  PCP:  No PCP Per Patient    Chief Complaint: Annual Exam and other   History of Present Illness:  This is a 25 y.o. female with PMH obesity who is presenting for CPE.  Takes ibuprofen 800 mg 2-3 times a week for migraines. This helps a lot. She is wanting refill of this.  Pt reports her and her husband recently started trying to get pregnant. Never been pregnant before. She is wanting some fertility counseling. She tried taking prenatal vitamins but made her nauseated.  LMP: 06/26/15. Periods are about every 28 days. Does get some premenstrual symptoms, abdominal cramping. Last pap: never Sexual history: sexually active with husband Immunizations: tdap 2013. 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  Dentist: never Eye: vision good, wears glasses. Diet/Exercise: has not exercised in 2-3 months. States she gets really frustrated by exercise because she never sees results. Diet is erratic. Doesn't eat regular meals, eats a lot of sweets. Fam hx: everyone with migraines, dad prediabetes. No fam hx CAD, CVA, cancer Tobacco/alcohol/substance use: no/no/no Mood is good. Sleeps well. She quit nursing school after 1 year, states she realized it wasn't for her. 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  Review of Systems:  Review of Systems  Constitutional: Negative.   HENT: Positive for sneezing.   Eyes: Positive for itching.  Respiratory: Negative.   Cardiovascular: Negative.   Gastrointestinal: Negative.   Endocrine: Negative.   Genitourinary: Negative.   Musculoskeletal: Negative.   Skin: Negative.   Allergic/Immunologic: Negative.   Neurological: Negative.   Hematological: Negative.   Psychiatric/Behavioral: Negative.     Patient Active  Problem List   Diagnosis Date Noted  . Obesity, unspecified 06/17/2013    Prior to Admission medications   Not on File    No Known Allergies  History reviewed. No pertinent past surgical history.  Social History  Substance Use Topics  . Smoking status: Never Smoker   . Smokeless tobacco: Never Used  . Alcohol Use: No    History reviewed. No pertinent family history.  Medication list has been reviewed and updated.  Physical Examination:  Physical Exam  Constitutional: She is oriented to person, place, and time.  HENT:  Head: Normocephalic and atraumatic.  Right Ear: Hearing, tympanic membrane, external ear and ear canal normal.  Left Ear: Hearing, tympanic membrane, external ear and ear canal normal.  Nose: Nose normal.  Mouth/Throat: Uvula is midline, oropharynx is clear and moist and mucous membranes are normal.  Eyes: Conjunctivae, EOM and lids are normal. Pupils are equal, round, and reactive to light. Right eye exhibits no discharge. Left eye exhibits no discharge. No scleral icterus.  Neck: Trachea normal. Carotid bruit is not present.  Cardiovascular: Normal rate, regular rhythm, normal heart sounds, intact distal pulses and normal pulses.   No murmur heard. Pulmonary/Chest: Effort normal and breath sounds normal. She has no wheezes. She has no rhonchi. She has no rales. Right breast exhibits no inverted nipple, no mass, no nipple discharge, no skin change and no tenderness. Left breast exhibits no inverted nipple, no mass, no nipple discharge, no skin change and no tenderness. Breasts are symmetrical.  Abdominal: Soft. Normal appearance. There  is no tenderness.  obese  Genitourinary: Vagina normal and uterus normal. There is no lesion on the right labia. There is no lesion on the left labia. Cervix exhibits no motion tenderness, no discharge and no friability. Right adnexum displays no tenderness and no fullness. Left adnexum displays no tenderness and no fullness. No  vaginal discharge found.  Musculoskeletal: Normal range of motion.  Lymphadenopathy:       Head (right side): No submental, no submandibular and no tonsillar adenopathy present.       Head (left side): No submental, no submandibular and no tonsillar adenopathy present.    She has no cervical adenopathy.       Right: No supraclavicular adenopathy present.       Left: No supraclavicular adenopathy present.  Neurological: She is alert and oriented to person, place, and time. Coordination and gait normal.  Skin: Skin is warm, dry and intact. No lesion and no rash noted.  Psychiatric: She has a normal mood and affect. Her speech is normal and behavior is normal. Thought content normal.   BP 110/80 mmHg  Pulse 79  Temp(Src) 98.5 F (36.9 C) (Oral)  Resp 16  Ht 5' 2"  (1.575 m)  Wt 227 lb (102.967 kg)  BMI 41.51 kg/m2  SpO2 98%  LMP 06/26/2015 (Exact Date)   Visual Acuity Screening   Right eye Left eye Both eyes  Without correction:     With correction: 20/20 20/20 20/20    Results for orders placed or performed in visit on 07/14/15  POCT urine pregnancy  Result Value Ref Range   Preg Test, Ur Negative Negative   Assessment and Plan:  1. Annual physical exam - CBC  2. Lipid screening - Lipid panel  3. Screening for HIV (human immunodeficiency virus) - HIV antibody  4. Morbid obesity, unspecified obesity type ([place]) We discussed importance of wt loss - we discussed diet and exercise techniques. Wt loss esp important if she wants to get pregnant. - Comprehensive metabolic panel - TSH  5. Cervical cancer screening - Pap IG and Chlamydia/Gonococcus, NAA  6. Desire for pregnancy Advised start on different prenatal with at least 481 mg folic acid daily. We discussed tracking her periods and having sex during fertile periods, no more than every other day to increased quality of sperm. We discussed importance of wt loss. She is up to date on immunizations. Ibuprofen works well  for her headaches. We discussed pros/cons of taking ibuprofen while trying to pregnant and I let her know she should not take ibuprofen while pregnant. She wanted a refill still for now. - POCT urine pregnancy  7. Migraine without status migrainosus, not intractable, unspecified migraine type - ibuprofen (ADVIL,MOTRIN) 800 MG tablet; Take 1 tablet (800 mg total) by mouth every 8 (eight) hours as needed.  Dispense: 30 tablet; Refill: 1   Nicole V. Drenda Freeze, MHS [facility]  07/17/2015

## 2015-07-14 NOTE — Patient Instructions (Addendum)
[id number] mg a day of folic acid. cyclebeads is an app for tracking your period and fertile periods. During fertile time, have sex every other day. Work on diet and exercise -- weight loss will be important for getting pregnant and staying pregnant. Exercise 4 times a day, 30 minutes a day at least. Make appt with dentist - look up Dr. Rollene Fare on [place]. I have refilled your ibuprofen --- if you get pregnant you will have to stop taking ibuprofen and switch to tylenol I will call you with your lab tests.    IF you received an x-ray today, you will receive an invoice from [facility]. Please contact [facility] at [phone] with questions or concerns regarding your invoice.   IF you received labwork today, you will receive an invoice from [employer]. Please contact [facility] at [phone] with questions or concerns regarding your invoice.   Our billing staff will not be able to assist you with questions regarding bills from these companies.  You will be contacted with the lab results as soon as they are available. The fastest way to get your results is to activate your My Chart account. Instructions are located on the last page of this paperwork. If you have not heard from [place] regarding the results in 2 weeks, please contact this office.

## 2015-07-15 LAB — LIPID PANEL
CHOL/HDL RATIO: 5 ratio (ref <=5.0)
Cholesterol: 184 mg/dL (ref 125–200)
HDL: 37 mg/dL — ABNORMAL LOW (ref >=46)
LDL CALC: 131 mg/dL — AB (ref <130)
TRIGLYCERIDES: 81 mg/dL (ref <150)
VLDL: 16 mg/dL (ref <30)

## 2015-07-15 LAB — HIV ANTIBODY (ROUTINE TESTING W REFLEX): HIV 1&2 Ab, 4th Generation: NONREACTIVE

## 2015-07-15 LAB — COMPREHENSIVE METABOLIC PANEL
ALK PHOS: 50 U/L (ref 33–115)
ALT: 15 U/L (ref 6–29)
AST: 22 U/L (ref 10–30)
Albumin: 4.5 g/dL (ref 3.6–5.1)
BILIRUBIN TOTAL: 0.3 mg/dL (ref 0.2–1.2)
BUN: 13 mg/dL (ref 7–25)
CO2: 23 mmol/L (ref 20–31)
Calcium: 9.8 mg/dL (ref 8.6–10.2)
Chloride: 104 mmol/L (ref 98–110)
Creat: 1.09 mg/dL (ref 0.50–1.10)
GLUCOSE: 82 mg/dL (ref 65–99)
Potassium: 4.3 mmol/L (ref 3.5–5.3)
Sodium: 137 mmol/L (ref 135–146)
Total Protein: 8.3 g/dL — ABNORMAL HIGH (ref 6.1–8.1)

## 2015-07-15 LAB — CBC
HCT: 32.3 % — ABNORMAL LOW (ref 35.0–45.0)
Hemoglobin: 10.2 g/dL — ABNORMAL LOW (ref 11.7–15.5)
MCH: 23.6 pg — ABNORMAL LOW (ref 27.0–33.0)
MCHC: 31.6 g/dL — AB (ref 32.0–36.0)
MCV: 74.6 fL — ABNORMAL LOW (ref 80.0–100.0)
MPV: 9.4 fL (ref 7.5–12.5)
PLATELETS: 353 10*3/uL (ref 140–400)
RBC: 4.33 MIL/uL (ref 3.80–5.10)
RDW: 15.8 % — AB (ref 11.0–15.0)
WBC: 11.5 10*3/uL — AB (ref 3.8–10.8)

## 2015-07-15 LAB — TSH: TSH: 5.87 mIU/L — ABNORMAL HIGH

## 2015-07-17 DIAGNOSIS — G43909 Migraine, unspecified, not intractable, without status migrainosus: Secondary | ICD-10-CM | POA: Insufficient documentation

## 2015-07-17 LAB — PAP IG AND CT-NG NAA
Chlamydia Probe Amp: NOT DETECTED
GC PROBE AMP: NOT DETECTED

## 2015-10-16 ENCOUNTER — Ambulatory Visit (INDEPENDENT_AMBULATORY_CARE_PROVIDER_SITE_OTHER): Payer: BLUE CROSS/BLUE SHIELD | Admitting: Physician Assistant

## 2015-10-16 ENCOUNTER — Inpatient Hospital Stay (HOSPITAL_COMMUNITY): Payer: BLUE CROSS/BLUE SHIELD

## 2015-10-16 ENCOUNTER — Encounter (HOSPITAL_COMMUNITY): Payer: Self-pay | Admitting: *Deleted

## 2015-10-16 ENCOUNTER — Inpatient Hospital Stay (HOSPITAL_COMMUNITY)
Admission: AD | Admit: 2015-10-16 | Discharge: 2015-10-16 | Disposition: A | Discharge status: Home / Self Care | Payer: BLUE CROSS/BLUE SHIELD | Source: Ambulatory Visit | Attending: Obstetrics & Gynecology | Admitting: Obstetrics & Gynecology

## 2015-10-16 VITALS — BP 96/62 | HR 85 | Temp 99.0°F | Resp 16 | Ht 61.5 in | Wt 229.8 lb

## 2015-10-16 DIAGNOSIS — O360110 Maternal care for anti-D [Rh] antibodies, first trimester, not applicable or unspecified: Secondary | ICD-10-CM

## 2015-10-16 DIAGNOSIS — Z3A01 Less than 8 weeks gestation of pregnancy: Secondary | ICD-10-CM | POA: Diagnosis not present

## 2015-10-16 DIAGNOSIS — D259 Leiomyoma of uterus, unspecified: Secondary | ICD-10-CM | POA: Insufficient documentation

## 2015-10-16 DIAGNOSIS — N926 Irregular menstruation, unspecified: Secondary | ICD-10-CM | POA: Diagnosis not present

## 2015-10-16 DIAGNOSIS — N939 Abnormal uterine and vaginal bleeding, unspecified: Secondary | ICD-10-CM | POA: Diagnosis not present

## 2015-10-16 DIAGNOSIS — O4691 Antepartum hemorrhage, unspecified, first trimester: Secondary | ICD-10-CM | POA: Diagnosis not present

## 2015-10-16 DIAGNOSIS — O3411 Maternal care for benign tumor of corpus uteri, first trimester: Secondary | ICD-10-CM | POA: Diagnosis not present

## 2015-10-16 DIAGNOSIS — O3680X0 Pregnancy with inconclusive fetal viability, not applicable or unspecified: Secondary | ICD-10-CM

## 2015-10-16 DIAGNOSIS — R87610 Atypical squamous cells of undetermined significance on cytologic smear of cervix (ASC-US): Secondary | ICD-10-CM | POA: Insufficient documentation

## 2015-10-16 DIAGNOSIS — O209 Hemorrhage in early pregnancy, unspecified: Secondary | ICD-10-CM | POA: Diagnosis present

## 2015-10-16 LAB — CBC WITH DIFFERENTIAL/PLATELET
BASOS ABS: 0 10*3/uL (ref 0.0–0.1)
Basophils Relative: 0 %
EOS ABS: 0.2 10*3/uL (ref 0.0–0.7)
EOS PCT: 2 %
HCT: 32.7 % — ABNORMAL LOW (ref 36.0–46.0)
Hemoglobin: 10.8 g/dL — ABNORMAL LOW (ref 12.0–15.0)
LYMPHS ABS: 5.7 10*3/uL — AB (ref 0.7–4.0)
Lymphocytes Relative: 45 %
MCH: 24.3 pg — ABNORMAL LOW (ref 26.0–34.0)
MCHC: 33 g/dL (ref 30.0–36.0)
MCV: 73.5 fL — ABNORMAL LOW (ref 78.0–100.0)
MONO ABS: 0.4 10*3/uL (ref 0.1–1.0)
Monocytes Relative: 3 %
Neutro Abs: 6.3 10*3/uL (ref 1.7–7.7)
Neutrophils Relative %: 50 %
PLATELETS: 300 10*3/uL (ref 150–400)
RBC: 4.45 MIL/uL (ref 3.87–5.11)
RDW: 16.9 % — AB (ref 11.5–15.5)
WBC: 12.6 10*3/uL — AB (ref 4.0–10.5)

## 2015-10-16 LAB — ABO/RH: ABO/RH(D): O NEG

## 2015-10-16 LAB — HCG, QUANTITATIVE, PREGNANCY: HCG, BETA CHAIN, QUANT, S: 1204 m[IU]/mL — AB (ref <5)

## 2015-10-16 LAB — POCT URINE PREGNANCY: PREG TEST UR: POSITIVE — AB

## 2015-10-16 MED ORDER — COMPLETENATE 29-1 MG PO CHEW: 1.0000 | CHEWABLE_TABLET | Freq: Every day | ORAL | Status: DC

## 2015-10-16 MED ORDER — RHO D IMMUNE GLOBULIN 1500 UNIT/2ML IJ SOSY
300.0000 ug | PREFILLED_SYRINGE | Freq: Once | INTRAMUSCULAR | Status: AC
  Administered 2015-10-16: 300 ug via INTRAMUSCULAR
  Filled 2015-10-16: qty 2

## 2015-10-16 NOTE — MAU Provider Note (Signed)
Chief Complaint: Vaginal Bleeding   First Provider Initiated Contact with Patient 10/16/15 1907     SUBJECTIVE HPI: Danielle Cisneros is a 25 y.o. G1P0 at [redacted]w[redacted]d who presents to [facility] Admissions reporting vaginal bleeding 1 week.   Vaginal bleeding Severity: Lighter than a period. Duration: One week Course: Unchanged Context: None Timing: Intermittent Modifying factors: None Associated signs and symptoms: Negative for passage of tissue or clots, abdominal pain.  Gonorrhea and Chlamydia cultures at primary care office 1 month ago. Declines repeat. Had ASCUS Pap smear at that visit.  History reviewed. No pertinent past medical history. OB History  Gravida Para Term Preterm AB SAB TAB Ectopic Multiple Living  1             # Outcome Date GA Lbr Len/2nd Weight Sex Delivery Anes PTL Lv  1 Current              History reviewed. No pertinent past surgical history. Social History   Social History  . Marital Status: Married    Spouse Name: n/a  . Number of Children: 0  . Years of Education: N/A   Occupational History  . [employer]   . Habilitation Technician    Social History Main Topics  . Smoking status: Never Smoker   . Smokeless tobacco: Never Used  . Alcohol Use: No  . Drug Use: No  . Sexual Activity: Not on file   Other Topics Concern  . Not on file   Social History Narrative   Lives with her father.   Born in [place].  Came to the [place] at age 81.   No current facility-administered medications on file prior to encounter.   Current Outpatient Prescriptions on File Prior to Encounter  Medication Sig Dispense Refill  . ibuprofen (ADVIL,MOTRIN) 800 MG tablet Take 1 tablet (800 mg total) by mouth every 8 (eight) hours as needed. (Patient not taking: Reported on 10/16/2015) 30 tablet 1   No Known Allergies  I have reviewed the past Medical Hx, Surgical Hx, Social Hx, Allergies and Medications.   Review of Systems  Constitutional: Negative for chills and  fatigue.  Gastrointestinal: Negative for abdominal pain.  Genitourinary: Positive for vaginal bleeding. Negative for dysuria, hematuria, vaginal discharge and vaginal pain.  Neurological: Negative for dizziness.    OBJECTIVE Patient Vitals for the past 24 hrs:  BP Temp Temp src Pulse Resp Height Weight  10/16/15 1632 113/63 mmHg 99.1 F (37.3 C) Oral 74 18 5\' 2"  (1.575 m) 230 lb 12.8 oz (104.69 kg)   Constitutional: Well-developed, well-nourished female in no acute distress.  Cardiovascular: normal rate Respiratory: normal rate and effort.  GI: Abd soft, non-tender.  MS: Extremities nontender, no edema, normal ROM Neurologic: Alert and oriented x 4.  GU: Neg CVAT.  SPECULUM EXAM: NEFG, small amount of dark red blood. Physiologic discharge. Cervix clean.  BIMANUAL: cervix long and closed; uterus slightly enlarged, but exam limited by body habitus., no adnexal tenderness or masses. No CMT.  LAB RESULTS Results for orders placed or performed during the hospital encounter of 10/16/15 (from the past 24 hour(s))  Rh IG workup (includes ABO/Rh)     Status: None (Preliminary result)   Collection Time: 10/16/15  5:19 PM  Result Value Ref Range   Gestational Age(Wks) 5.1    ABO/RH(D) O NEG    Antibody Screen NEG    Unit Number [id number]    Blood Component Type RHIG    Unit division 00    Status of Unit  ISSUED    Transfusion Status OK TO TRANSFUSE   CBC with Differential/Platelet     Status: Abnormal   Collection Time: 10/16/15  5:23 PM  Result Value Ref Range   WBC 12.6 (H) 4.0 - 10.5 K/uL   RBC 4.45 3.87 - 5.11 MIL/uL   Hemoglobin 10.8 (L) 12.0 - 15.0 g/dL   HCT 32.7 (L) 36.0 - 46.0 %   MCV 73.5 (L) 78.0 - 100.0 fL   MCH 24.3 (L) 26.0 - 34.0 pg   MCHC 33.0 30.0 - 36.0 g/dL   RDW 16.9 (H) 11.5 - 15.5 %   Platelets 300 150 - 400 K/uL   Neutrophils Relative % 50 %   Neutro Abs 6.3 1.7 - 7.7 K/uL   Lymphocytes Relative 45 %   Lymphs Abs 5.7 (H) 0.7 - 4.0 K/uL   Monocytes  Relative 3 %   Monocytes Absolute 0.4 0.1 - 1.0 K/uL   Eosinophils Relative 2 %   Eosinophils Absolute 0.2 0.0 - 0.7 K/uL   Basophils Relative 0 %   Basophils Absolute 0.0 0.0 - 0.1 K/uL  hCG, quantitative, pregnancy     Status: Abnormal   Collection Time: 10/16/15  5:23 PM  Result Value Ref Range   hCG, Beta Chain, Quant, S 1204 (H) <5 mIU/mL  ABO/Rh     Status: None   Collection Time: 10/16/15  5:23 PM  Result Value Ref Range   ABO/RH(D) O NEG     IMAGING US Ob Comp Less 14 Wks  10/16/2015  CLINICAL DATA:  25 year old G 1, LMP 09/10/2015 (5 weeks 1 day, EDC 06/16/2016 by LMP), quantitative beta HCG 1,204, presenting with 7 day history of vaginal bleeding. EXAM: OBSTETRIC <14 WK [place] AND TRANSVAGINAL OB US TECHNIQUE: Both transabdominal and transvaginal ultrasound examinations were performed for complete evaluation of the gestation as well as the maternal uterus, adnexal regions, and pelvic cul-de-sac. Transvaginal technique was performed to assess early pregnancy. COMPARISON:  None. FINDINGS: Intrauterine gestational sac: Not visualized. Yolk sac:  Not visualized. Embryo:  Not visualized. Cardiac Activity: Not applicable. MSD: Not applicable. [place] EDC: Not applicable. Subchorionic hemorrhage:  Not applicable. Maternal uterus/adnexae: Endometrial thickening up to approximately 13 mm without evidence of an intrauterine gestational sac. Large uterine fibroids, a broad-based subserosal fibroid arising from the right lateral uterine body and right lateral fundus measuring approximately 7.1 x 5.6 x 5.5 cm, and a subserosal fibroid arising from the left uterine fundus measuring approximately 4.1 x 3.8 x 3.9 cm. Normal-appearing ovaries bilaterally, the right measuring approximately 3.3 x 2.3 x 2.3 cm and the left measuring approximately 3.1 x 1.7 x 2.2 cm. No adnexal masses or free pelvic fluid. IMPRESSION: 1. No evidence of intrauterine pregnancy. With a positive quantitative beta HCG, an ectopic is not  entirely excluded, though there is no visible adnexal mass or free pelvic fluid. 2. Large uterine fibroids. 3. Endometrial thickening up to approximately 13 mm. 4. Normal-appearing ovaries bilaterally. Serial quantitative beta HCG determination and depending on the quantitative beta HCG levels, a followup ultrasound in 1-2 weeks may be helpful. Electronically Signed   By: Evangeline Dakin M.D.   On: 10/16/2015 20:06   US Ob Transvaginal  10/16/2015  CLINICAL DATA:  25 year old G 1, LMP 09/10/2015 (5 weeks 1 day, EDC 06/16/2016 by LMP), quantitative beta HCG 1,204, presenting with 7 day history of vaginal bleeding. EXAM: OBSTETRIC <14 WK [place] AND TRANSVAGINAL OB US TECHNIQUE: Both transabdominal and transvaginal ultrasound examinations were performed for complete evaluation of  the gestation as well as the maternal uterus, adnexal regions, and pelvic cul-de-sac. Transvaginal technique was performed to assess early pregnancy. COMPARISON:  None. FINDINGS: Intrauterine gestational sac: Not visualized. Yolk sac:  Not visualized. Embryo:  Not visualized. Cardiac Activity: Not applicable. MSD: Not applicable. [place] EDC: Not applicable. Subchorionic hemorrhage:  Not applicable. Maternal uterus/adnexae: Endometrial thickening up to approximately 13 mm without evidence of an intrauterine gestational sac. Large uterine fibroids, a broad-based subserosal fibroid arising from the right lateral uterine body and right lateral fundus measuring approximately 7.1 x 5.6 x 5.5 cm, and a subserosal fibroid arising from the left uterine fundus measuring approximately 4.1 x 3.8 x 3.9 cm. Normal-appearing ovaries bilaterally, the right measuring approximately 3.3 x 2.3 x 2.3 cm and the left measuring approximately 3.1 x 1.7 x 2.2 cm. No adnexal masses or free pelvic fluid. IMPRESSION: 1. No evidence of intrauterine pregnancy. With a positive quantitative beta HCG, an ectopic is not entirely excluded, though there is no visible adnexal mass or  free pelvic fluid. 2. Large uterine fibroids. 3. Endometrial thickening up to approximately 13 mm. 4. Normal-appearing ovaries bilaterally. Serial quantitative beta HCG determination and depending on the quantitative beta HCG levels, a followup ultrasound in 1-2 weeks may be helpful. Electronically Signed   By: Evangeline Dakin M.D.   On: 10/16/2015 20:06    MAU COURSE CBC, Quant, ABO/Rh, ultrasound, speculum exam, Rhophylac given.  MDM - Vaginal bleeding in early pregnancy with pregnancy of unknown anatomic location, but hemodynamically stable. - Fibroid uterus - Rh- antepartum. Rhophylac given.  ASSESSMENT 1. Pregnancy of unknown anatomic location   2. Vaginal bleeding in pregnancy, first trimester   3. Uterine fibroids affecting pregnancy in first trimester, antepartum     PLAN Discharge home in stable condition. SAB/ectopic precautions Follow-up Information    Follow up with [facility] On 10/18/2015.   Why:  For repeat blood work or sooner as needed if symptoms worsen   Contact information:   [id number] [place] [phone]       Medication List    STOP taking these medications        ibuprofen 800 MG tablet  Commonly known as:  ADVIL,MOTRIN      TAKE these medications        prenatal vitamin w/FE, FA 29-1 MG Chew chewable tablet  Chew 1 tablet by mouth daily at 12 noon.         [place], [place] 10/16/2015  9:33 PM  4

## 2015-10-16 NOTE — Progress Notes (Signed)
   10/16/2015 3:42 PM   DOB: 12-Nov-1990 / MRN: [id number]  SUBJECTIVE:  Danielle Cisneros is a 25 y.o. female presenting for a pregnancy test and menstrual bleeding that has been present for the last 7 days. She describes the bleeding as similar to her period and does not mention the passage of tissue.  Her last period was roughly one month ago. She has been trying to become pregnant and this is the first time she has become pregnant.     She has No Known Allergies.   She  has no past medical history on file.    She  reports that she has never smoked. She has never used smokeless tobacco. She reports that she does not drink alcohol or use illicit drugs. She  has no sexual activity history on file. The patient  has no past surgical history on file.  Her family history is not on file.  Review of Systems  Constitutional: Negative for fever and chills.  Gastrointestinal: Negative for nausea.  Musculoskeletal: Negative for myalgias.  Skin: Negative for itching and rash.  Neurological: Negative for dizziness and headaches.    Problem list and medications reviewed and updated by myself where necessary, and exist elsewhere in the encounter.   OBJECTIVE:  BP 96/62 mmHg  Pulse 85  Temp(Src) 99 F (37.2 C) (Oral)  Resp 16  Ht 5' 1.5" (1.562 m)  Wt 229 lb 12.8 oz (104.237 kg)  BMI 42.72 kg/m2  SpO2 99%  LMP 09/10/2015  Physical Exam  Constitutional: She is oriented to person, place, and time.  HENT:  Nose: Nose normal.  Cardiovascular: Normal rate and regular rhythm.   Pulmonary/Chest: Effort normal and breath sounds normal.  Musculoskeletal: Normal range of motion.  Neurological: She is alert and oriented to person, place, and time.  Skin: Skin is warm and dry.  Psychiatric: She has a normal mood and affect.    Results for orders placed or performed in visit on 10/16/15 (from the past 72 hour(s))  POCT urine pregnancy     Status: Abnormal   Collection Time: 10/16/15  3:13 PM    Result Value Ref Range   Preg Test, Ur Positive (A) Negative    No results found.  ASSESSMENT AND PLAN  Chara was seen today for possible pregnancy.  Diagnoses and all orders for this visit:  Missed period -     POCT urine pregnancy  Abnormal uterine bleeding unrelated to menstrual cycle Comments: She is having mestrual bleeding for 6-7 days now.  There is no abdominal pian.  I suspect she may be having an abortion. Advised that she proceed to the MAU for further work up.      The patient was advised to call or return to clinic if she does not see an improvement in symptoms, or to seek the care of the closest emergency department if she worsens with the above plan.   Philis Fendt, MHS, PA-C [facility] 10/16/2015 3:42 PM

## 2015-10-16 NOTE — Patient Instructions (Addendum)
[facility] [place], [place] [address]  Phone: [phone]     IF you received an x-ray today, you will receive an invoice from [facility]. Please contact [facility] at [phone] with questions or concerns regarding your invoice.   IF you received labwork today, you will receive an invoice from [employer]. Please contact [facility] at [phone] with questions or concerns regarding your invoice.   Our billing staff will not be able to assist you with questions regarding bills from these companies.  You will be contacted with the lab results as soon as they are available. The fastest way to get your results is to activate your My Chart account. Instructions are located on the last page of this paperwork. If you have not heard from [place] regarding the results in 2 weeks, please contact this office.

## 2015-10-16 NOTE — Discharge Instructions (Signed)
Vaginal Bleeding During Pregnancy, First Trimester A small amount of bleeding (spotting) from the vagina is relatively common in early pregnancy. It usually stops on its own. Various things may cause bleeding or spotting in early pregnancy. Some bleeding may be related to the pregnancy, and some may not. In most cases, the bleeding is normal and is not a problem. However, bleeding can also be a sign of something serious. Be sure to tell your health care provider about any vaginal bleeding right away. Some possible causes of vaginal bleeding during the first trimester include:  Infection or inflammation of the cervix.  Growths (polyps) on the cervix.  Miscarriage or threatened miscarriage.  Pregnancy tissue has developed outside of the uterus and in a fallopian tube (tubal pregnancy).  Tiny cysts have developed in the uterus instead of pregnancy tissue (molar pregnancy). HOME CARE INSTRUCTIONS  Watch your condition for any changes. The following actions may help to lessen any discomfort you are feeling:  Follow your health care provider's instructions for limiting your activity. If your health care provider orders bed rest, you may need to stay in bed and only get up to use the bathroom. However, your health care provider may allow you to continue light activity.  If needed, make plans for someone to help with your regular activities and responsibilities while you are on bed rest.  Keep track of the number of pads you use each day, how often you change pads, and how soaked (saturated) they are. Write this down.  Do not use tampons. Do not douche.  Do not have sexual intercourse or orgasms until approved by your health care provider.  If you pass any tissue from your vagina, save the tissue so you can show it to your health care provider.  Only take over-the-counter or prescription medicines as directed by your health care provider.  Do not take aspirin because it can make you  bleed.  Keep all follow-up appointments as directed by your health care provider. SEEK MEDICAL CARE IF:  You have any vaginal bleeding during any part of your pregnancy.  You have cramps or labor pains.  You have a fever, not controlled by medicine. SEEK IMMEDIATE MEDICAL CARE IF:   You have severe cramps in your back or belly (abdomen).  You pass large clots or tissue from your vagina.  Your bleeding increases.  You feel light-headed or weak, or you have fainting episodes.  You have chills.  You are leaking fluid or have a gush of fluid from your vagina.  You pass out while having a bowel movement. MAKE SURE YOU:  Understand these instructions.  Will watch your condition.  Will get help right away if you are not doing well or get worse.   This information is not intended to replace advice given to you by your health care provider. Make sure you discuss any questions you have with your health care provider.   Document Released: 12/30/2004 Document Revised: 03/27/2013 Document Reviewed: 11/27/2012 Elsevier Interactive Patient Education 2016 Elsevier Inc.  Uterine Fibroids Uterine fibroids are tissue masses (tumors) that can develop in the womb (uterus). They are also called leiomyomas. This type of tumor is not cancerous (benign) and does not spread to other parts of the body outside of the pelvic area, which is between the hip bones. Occasionally, fibroids may develop in the fallopian tubes, in the cervix, or on the support structures (ligaments) that surround the uterus. You can have one or many fibroids. Fibroids can vary  in size, weight, and where they grow in the uterus. Some can become quite large. Most fibroids do not require medical treatment. CAUSES A fibroid can develop when a single uterine cell keeps growing (replicating). Most cells in the human body have a control mechanism that keeps them from replicating without control. SIGNS AND SYMPTOMS Symptoms may  include:   Heavy bleeding during your period.  Bleeding or spotting between periods.  Pelvic pain and pressure.  Bladder problems, such as needing to urinate more often (urinary frequency) or urgently.  Inability to reproduce offspring (infertility).  Miscarriages. DIAGNOSIS Uterine fibroids are diagnosed through a physical exam. Your health care provider may feel the lumpy tumors during a pelvic exam. Ultrasonography and an MRI may be done to determine the size, location, and number of fibroids. TREATMENT Treatment may include:  Watchful waiting. This involves getting the fibroid checked by your health care provider to see if it grows or shrinks. Follow your health care provider's recommendations for how often to have this checked.  Hormone medicines. These can be taken by mouth or given through an intrauterine device (IUD).  Surgery.  Removing the fibroids (myomectomy) or the uterus (hysterectomy).  Removing blood supply to the fibroids (uterine artery embolization). If fibroids interfere with your fertility and you want to become pregnant, your health care provider may recommend having the fibroids removed.  HOME CARE INSTRUCTIONS  Keep all follow-up visits as directed by your health care provider. This is important.  Take medicines only as directed by your health care provider.  If you were prescribed a hormone treatment, take the hormone medicines exactly as directed.  Do not take aspirin, because it can cause bleeding.  Ask your health care provider about taking iron pills and increasing the amount of dark green, leafy vegetables in your diet. These actions can help to boost your blood iron levels, which may be affected by heavy menstrual bleeding.  Pay close attention to your period and tell your health care provider about any changes, such as:  Increased blood flow that requires you to use more pads or tampons than usual per month.  A change in the number of days  that your period lasts per month.  A change in symptoms that are associated with your period, such as abdominal cramping or back pain. SEEK MEDICAL CARE IF:  You have pelvic pain, back pain, or abdominal cramps that cannot be controlled with medicines.  You have an increase in bleeding between and during periods.  You soak tampons or pads in a half hour or less.  You feel lightheaded, extra tired, or weak. SEEK IMMEDIATE MEDICAL CARE IF:  You faint.  You have a sudden increase in pelvic pain.   This information is not intended to replace advice given to you by your health care provider. Make sure you discuss any questions you have with your health care provider.   Document Released: 03/19/2000 Document Revised: 04/12/2014 Document Reviewed: 09/18/2013 Elsevier Interactive Patient Education 2016 Elsevier Inc.  Rh Incompatibility Rh incompatibility is a condition that occurs during pregnancy if a woman has Rh-negative blood and her baby has Rh-positive blood. "Rh-negative" and "Rh-positive" refer to whether or not the blood has an Rh factor. An Rh factor is a specific protein found on the surface of red blood cells. If a woman has Rh factor, she is Rh-positive. If she does not have an Rh factor, she is Rh-negative. Having or not having an Rh factor does not affect the mother's general  health. However, it can cause problems during pregnancy.  WHAT KIND OF PROBLEMS CAN Rh INCOMPATIBILITY CAUSE? During pregnancy, blood from the baby can cross into the mother's bloodstream, especially during delivery. If a mother is Rh-negative and the baby is Rh-positive, the mother's defense system will react to the baby's blood as if it was a foreign substance and will create proteins (antibodies). This is called sensitization. Once the mother is sensitized, her Rh antibodies will cross the placenta to the baby and attack the baby's Rh-positive blood as if it is a harmful substance.  Rh incompatibility can  also happen if the Rh-negative pregnant woman is exposed to the Rh factor during a blood transfusion with Rh-positive blood.  HOW DOES THIS CONDITION AFFECT MY BABY? The Rh antibodies that attack and destroy the baby's red blood cells can lead to hemolytic disease in the baby. Hemolytic disease is when the red blood cells break down. This can cause:   Yellowing of the skin and eyes (jaundice).  The body to not have enough healthy red blood cells (anemia).   Brain damage.   Heart failure.   Death.  These antibodies usually do not cause problems during a first pregnancy. This is because the blood from the baby often times crosses into the mother's bloodstream during delivery, and the baby is born before many of the antibodies can develop. However, the antibodies stay in your body once they have formed. Because of this, Rh incompatibility is more likely to cause problems in second or later pregnancies (if the baby is Rh-positive).  HOW IS THIS CONDITION DIAGNOSED? When a woman becomes pregnant, blood tests may be done to find out her blood type and Rh factor. If the woman is Rh-negative, she also may have another blood test called an antibody screen. The antibody screen shows whether she has Rh antibodies in her blood. If she does, it means she was exposed to Rh-positive blood before, and she is at risk for Rh incompatibility.  To find out whether the baby is developing hemolytic anemia and how serious it is, caregivers may use more advanced tests, such as ultrasonography (commonly known as ultrasound).  HOW IS Rh INCOMPATIBILITY TREATED?  Rh incompatibility is treated with a shot of medicine called Rho (D) immune globulin. This medicine keeps the woman's body from making antibodies that can cause serious problems in the baby or future babies.  Two shots will be given, one at around your seventh month of pregnancy and the other within 72 hours of your baby being born. If you are Rh-negative, you  will need this medicine every time you have a baby with Rh-positive blood. If you already have antibodies in your blood, Rho (D) immune globulin will not help. Your doctor will not give you this medicine, but will watch your pregnancy closely for problems instead.  This shot may also be given to an Rh-negative woman when the risk of blood transfer between the mom and baby is high. The risk is high with:   An amniocentesis.   A miscarriage or an abortion.   An ectopic pregnancy.   Any vaginal bleeding during pregnancy.    This information is not intended to replace advice given to you by your health care provider. Make sure you discuss any questions you have with your health care provider.   Document Released: 09/11/2001 Document Revised: 03/27/2013 Document Reviewed: 07/04/2012 Elsevier Interactive Patient Education [employer].

## 2015-10-16 NOTE — MAU Note (Signed)
Pt reports she had missed her period took a HPT and it was positive. Has had spotting and bleeding for the past week. Denies any pain or cramping.

## 2015-10-17 LAB — RH IG WORKUP (INCLUDES ABO/RH)
ABO/RH(D): O NEG
Antibody Screen: NEGATIVE
Gestational Age(Wks): 5.1
UNIT DIVISION: 0

## 2015-10-17 LAB — RPR: RPR: NONREACTIVE

## 2015-10-17 LAB — HIV ANTIBODY (ROUTINE TESTING W REFLEX): HIV SCREEN 4TH GENERATION: NONREACTIVE

## 2015-10-18 ENCOUNTER — Encounter (HOSPITAL_COMMUNITY): Payer: Self-pay | Admitting: *Deleted

## 2015-10-18 ENCOUNTER — Inpatient Hospital Stay (HOSPITAL_COMMUNITY)
Admission: AD | Admit: 2015-10-18 | Discharge: 2015-10-19 | Disposition: A | Discharge status: Home / Self Care | Payer: BLUE CROSS/BLUE SHIELD | Source: Ambulatory Visit | Attending: Obstetrics & Gynecology | Admitting: Obstetrics & Gynecology

## 2015-10-18 DIAGNOSIS — Z79899 Other long term (current) drug therapy: Secondary | ICD-10-CM | POA: Diagnosis not present

## 2015-10-18 DIAGNOSIS — O209 Hemorrhage in early pregnancy, unspecified: Secondary | ICD-10-CM | POA: Diagnosis not present

## 2015-10-18 DIAGNOSIS — Z3A14 14 weeks gestation of pregnancy: Secondary | ICD-10-CM | POA: Diagnosis not present

## 2015-10-18 DIAGNOSIS — O3680X0 Pregnancy with inconclusive fetal viability, not applicable or unspecified: Secondary | ICD-10-CM

## 2015-10-18 DIAGNOSIS — O4691 Antepartum hemorrhage, unspecified, first trimester: Secondary | ICD-10-CM | POA: Diagnosis not present

## 2015-10-18 DIAGNOSIS — R1032 Left lower quadrant pain: Secondary | ICD-10-CM | POA: Insufficient documentation

## 2015-10-18 DIAGNOSIS — O26892 Other specified pregnancy related conditions, second trimester: Secondary | ICD-10-CM | POA: Insufficient documentation

## 2015-10-18 DIAGNOSIS — R1031 Right lower quadrant pain: Secondary | ICD-10-CM | POA: Diagnosis not present

## 2015-10-18 LAB — URINE MICROSCOPIC-ADD ON

## 2015-10-18 LAB — URINALYSIS, ROUTINE W REFLEX MICROSCOPIC
BILIRUBIN URINE: NEGATIVE
Glucose, UA: NEGATIVE mg/dL
KETONES UR: NEGATIVE mg/dL
Leukocytes, UA: NEGATIVE
NITRITE: NEGATIVE
PH: 7 (ref 5.0–8.0)
Protein, ur: NEGATIVE mg/dL
Specific Gravity, Urine: 1.01 (ref 1.005–1.030)

## 2015-10-18 LAB — HCG, QUANTITATIVE, PREGNANCY: hCG, Beta Chain, Quant, S: 728 m[IU]/mL — ABNORMAL HIGH (ref <5)

## 2015-10-18 NOTE — MAU Provider Note (Signed)
History     CSN: [id number]  Arrival date and time: 10/18/15 2236   First Provider Initiated Contact with Patient 10/18/15 2311      Chief Complaint  Patient presents with  . Abdominal Pain  . Vaginal Bleeding   HPI   [name] is a 25 y.o. female G1P0 @ [redacted]w[redacted]d  Here for a follow up beta hcg level; she was seen 2 days ago for vaginal bleeding, she was not experiencing pain at that time. The bleeding continues and is the same amount as it was 2 days ago. The bleeding is bright red and lighter than a normal period. She is having to wear a pad.   This morning she developed new onset, bilateral lower abdominal pain, the pain comes and goes. "feels like a bad cramp".   She currently rates her pain 4/10.   OB History    Gravida Para Term Preterm AB TAB SAB Ectopic Multiple Living   1               Past Medical History  Diagnosis Date  . Medical history non-contributory     Past Surgical History  Procedure Laterality Date  . No past surgeries      History reviewed. No pertinent family history.  Social History  Substance Use Topics  . Smoking status: Never Smoker   . Smokeless tobacco: Never Used  . Alcohol Use: No    Allergies: No Known Allergies  Prescriptions prior to admission  Medication Sig Dispense Refill Last Dose  . ibuprofen (ADVIL,MOTRIN) 200 MG tablet Take 200 mg by mouth every 6 (six) hours as needed.   10/18/2015 at 1500  . prenatal vitamin w/FE, FA (NATACHEW) 29-1 MG CHEW chewable tablet Chew 1 tablet by mouth daily at 12 noon. 30 tablet 12    Results for orders placed or performed during the hospital encounter of 10/18/15 (from the past 48 hour(s))  Urinalysis, Routine w reflex microscopic (not at [facility])     Status: Abnormal   Collection Time: 10/18/15 10:35 PM  Result Value Ref Range   Color, Urine YELLOW YELLOW   APPearance CLEAR CLEAR   Specific Gravity, Urine 1.010 1.005 - 1.030   pH 7.0 5.0 - 8.0   Glucose, UA NEGATIVE NEGATIVE mg/dL    Hgb urine dipstick LARGE (A) NEGATIVE   Bilirubin Urine NEGATIVE NEGATIVE   Ketones, ur NEGATIVE NEGATIVE mg/dL   Protein, ur NEGATIVE NEGATIVE mg/dL   Nitrite NEGATIVE NEGATIVE   Leukocytes, UA NEGATIVE NEGATIVE  Urine microscopic-add on     Status: Abnormal   Collection Time: 10/18/15 10:35 PM  Result Value Ref Range   Squamous Epithelial / LPF 0-5 (A) NONE SEEN   WBC, UA 0-5 0 - 5 WBC/hpf   RBC / HPF 6-30 0 - 5 RBC/hpf   Bacteria, UA RARE (A) NONE SEEN  hCG, quantitative, pregnancy     Status: Abnormal   Collection Time: 10/18/15 11:14 PM  Result Value Ref Range   hCG, Beta Chain, Quant, S 728 (H) <5 mIU/mL    Comment:          GEST. AGE      CONC.  (mIU/mL)   <=1 WEEK        5 - 50     2 WEEKS       50 - 500     3 WEEKS       100 - 10,000     4 WEEKS     1,000 -  30,000     5 WEEKS     3,500 - 115,000   6-8 WEEKS     12,000 - 270,000    12 WEEKS     15,000 - 220,000        FEMALE AND NON-PREGNANT FEMALE:     LESS THAN 5 mIU/mL   CBC     Status: Abnormal   Collection Time: 10/18/15 11:14 PM  Result Value Ref Range   WBC 12.7 (H) 4.0 - 10.5 K/uL   RBC 4.37 3.87 - 5.11 MIL/uL   Hemoglobin 10.3 (L) 12.0 - 15.0 g/dL   HCT 32.2 (L) 36.0 - 46.0 %   MCV 73.7 (L) 78.0 - 100.0 fL   MCH 23.6 (L) 26.0 - 34.0 pg   MCHC 32.0 30.0 - 36.0 g/dL   RDW 16.7 (H) 11.5 - 15.5 %   Platelets 321 150 - 400 K/uL    US Ob Comp Less 14 Wks  10/16/2015  CLINICAL DATA:  25 year old G 1, LMP 09/10/2015 (5 weeks 1 day, EDC 06/16/2016 by LMP), quantitative beta HCG 1,204, presenting with 7 day history of vaginal bleeding. EXAM: OBSTETRIC <14 WK [place] AND TRANSVAGINAL OB US TECHNIQUE: Both transabdominal and transvaginal ultrasound examinations were performed for complete evaluation of the gestation as well as the maternal uterus, adnexal regions, and pelvic cul-de-sac. Transvaginal technique was performed to assess early pregnancy. COMPARISON:  None. FINDINGS: Intrauterine gestational sac: Not  visualized. Yolk sac:  Not visualized. Embryo:  Not visualized. Cardiac Activity: Not applicable. MSD: Not applicable. [place] EDC: Not applicable. Subchorionic hemorrhage:  Not applicable. Maternal uterus/adnexae: Endometrial thickening up to approximately 13 mm without evidence of an intrauterine gestational sac. Large uterine fibroids, a broad-based subserosal fibroid arising from the right lateral uterine body and right lateral fundus measuring approximately 7.1 x 5.6 x 5.5 cm, and a subserosal fibroid arising from the left uterine fundus measuring approximately 4.1 x 3.8 x 3.9 cm. Normal-appearing ovaries bilaterally, the right measuring approximately 3.3 x 2.3 x 2.3 cm and the left measuring approximately 3.1 x 1.7 x 2.2 cm. No adnexal masses or free pelvic fluid. IMPRESSION: 1. No evidence of intrauterine pregnancy. With a positive quantitative beta HCG, an ectopic is not entirely excluded, though there is no visible adnexal mass or free pelvic fluid. 2. Large uterine fibroids. 3. Endometrial thickening up to approximately 13 mm. 4. Normal-appearing ovaries bilaterally. Serial quantitative beta HCG determination and depending on the quantitative beta HCG levels, a followup ultrasound in 1-2 weeks may be helpful. Electronically Signed   By: Evangeline Dakin M.D.   On: 10/16/2015 20:06   US Ob Transvaginal  10/16/2015  CLINICAL DATA:  25 year old G 1, LMP 09/10/2015 (5 weeks 1 day, EDC 06/16/2016 by LMP), quantitative beta HCG 1,204, presenting with 7 day history of vaginal bleeding. EXAM: OBSTETRIC <14 WK [place] AND TRANSVAGINAL OB US TECHNIQUE: Both transabdominal and transvaginal ultrasound examinations were performed for complete evaluation of the gestation as well as the maternal uterus, adnexal regions, and pelvic cul-de-sac. Transvaginal technique was performed to assess early pregnancy. COMPARISON:  None. FINDINGS: Intrauterine gestational sac: Not visualized. Yolk sac:  Not visualized. Embryo:  Not  visualized. Cardiac Activity: Not applicable. MSD: Not applicable. [place] EDC: Not applicable. Subchorionic hemorrhage:  Not applicable. Maternal uterus/adnexae: Endometrial thickening up to approximately 13 mm without evidence of an intrauterine gestational sac. Large uterine fibroids, a broad-based subserosal fibroid arising from the right lateral uterine body and right lateral fundus measuring approximately 7.1 x 5.6 x  5.5 cm, and a subserosal fibroid arising from the left uterine fundus measuring approximately 4.1 x 3.8 x 3.9 cm. Normal-appearing ovaries bilaterally, the right measuring approximately 3.3 x 2.3 x 2.3 cm and the left measuring approximately 3.1 x 1.7 x 2.2 cm. No adnexal masses or free pelvic fluid. IMPRESSION: 1. No evidence of intrauterine pregnancy. With a positive quantitative beta HCG, an ectopic is not entirely excluded, though there is no visible adnexal mass or free pelvic fluid. 2. Large uterine fibroids. 3. Endometrial thickening up to approximately 13 mm. 4. Normal-appearing ovaries bilaterally. Serial quantitative beta HCG determination and depending on the quantitative beta HCG levels, a followup ultrasound in 1-2 weeks may be helpful. Electronically Signed   By: Evangeline Dakin M.D.   On: 10/16/2015 20:06   Review of Systems  Constitutional: Negative for fever and chills.  Gastrointestinal: Positive for abdominal pain. Negative for nausea and vomiting.   Physical Exam   Blood pressure 128/67, pulse 77, temperature 98.7 F (37.1 C), resp. rate 18, height 5\' 2"  (1.575 m), weight 230 lb 3.2 oz (104.418 kg), last menstrual period 09/10/2015.  Physical Exam  Constitutional: She is oriented to person, place, and time. She appears well-developed and well-nourished.  Non-toxic appearance. She does not have a sickly appearance. She does not appear ill. No distress.  HENT:  Head: Normocephalic.  Eyes: Pupils are equal, round, and reactive to light.  GI: Soft. Normal appearance.  There is tenderness in the right lower quadrant, suprapubic area and left lower quadrant. There is no rigidity, no rebound and no guarding.  Musculoskeletal: Normal range of motion.  Neurological: She is alert and oriented to person, place, and time.  Skin: Skin is warm. She is not diaphoretic.  Psychiatric: Her behavior is normal.    MAU Course  Procedures  None  MDM  O negative blood type: rhogam received on: 7/13  Hcg level  7/13: 1204 Hcg level 7/15: 728  Toradol 60 mg IM Vicodin 1 tab given PO   Significant drop in beta hcg level> most consistent with SAB in process> due to absent of gestational sac or yolk sac on 7/13 [place], I will bring her back in 48 hours for serial quant.   Assessment and Plan   A:  1. Vaginal bleeding in pregnancy, first trimester: likely SAB   2. Pregnancy of unknown anatomic location      P:  Discharge home in stable condition  Patient to return to MAU on Tuesday @ 11:00 for serial- repeat beta hcg level. Patient aware Return to MAU if symptoms worsen, if pain worsens Pelvic rest Support given   Lezlie Lye, NP 10/19/2015 4:37 AM

## 2015-10-18 NOTE — MAU Note (Signed)
Was to come in for blood work for repeat BHCG. Having bad cramps all day but was not in the city and just got back in so am coming in late. Spotting for 7 days but alittle more today. Have used 2 pads today

## 2015-10-19 DIAGNOSIS — O4691 Antepartum hemorrhage, unspecified, first trimester: Secondary | ICD-10-CM

## 2015-10-19 LAB — CBC
HCT: 32.2 % — ABNORMAL LOW (ref 36.0–46.0)
Hemoglobin: 10.3 g/dL — ABNORMAL LOW (ref 12.0–15.0)
MCH: 23.6 pg — ABNORMAL LOW (ref 26.0–34.0)
MCHC: 32 g/dL (ref 30.0–36.0)
MCV: 73.7 fL — AB (ref 78.0–100.0)
PLATELETS: 321 10*3/uL (ref 150–400)
RBC: 4.37 MIL/uL (ref 3.87–5.11)
RDW: 16.7 % — ABNORMAL HIGH (ref 11.5–15.5)
WBC: 12.7 10*3/uL — AB (ref 4.0–10.5)

## 2015-10-19 MED ORDER — HYDROCODONE-ACETAMINOPHEN 5-325 MG PO TABS
1.0000 | ORAL_TABLET | Freq: Once | ORAL | Status: AC
  Administered 2015-10-19: 1 via ORAL
  Filled 2015-10-19: qty 1

## 2015-10-19 MED ORDER — IBUPROFEN 600 MG PO TABS: 600.0000 mg | ORAL_TABLET | Freq: Four times a day (QID) | ORAL | Status: DC | PRN

## 2015-10-19 MED ORDER — KETOROLAC TROMETHAMINE 60 MG/2ML IM SOLN
60.0000 mg | Freq: Once | INTRAMUSCULAR | Status: AC
  Administered 2015-10-19: 60 mg via INTRAMUSCULAR
  Filled 2015-10-19: qty 2

## 2015-10-19 NOTE — Progress Notes (Signed)
Jennifer Rasch NP in earlier to discuss test results and d/c plan. Written and verbal d/c instructions given and understanding voiced. 

## 2015-10-19 NOTE — Discharge Instructions (Signed)
Miscarriage  A miscarriage is the sudden loss of an unborn baby (fetus) before the 20th week of pregnancy. Most miscarriages happen in the first 3 months of pregnancy. Sometimes, it happens before a woman even knows she is pregnant. A miscarriage is also called a "spontaneous miscarriage" or "early pregnancy loss." Having a miscarriage can be an emotional experience. Talk with your caregiver about any questions you may have about miscarrying, the grieving process, and your future pregnancy plans.  CAUSES    Problems with the fetal chromosomes that make it impossible for the baby to develop normally. Problems with the baby's genes or chromosomes are most often the result of errors that occur, by chance, as the embryo divides and grows. The problems are not inherited from the parents.   Infection of the cervix or uterus.    Hormone problems.    Problems with the cervix, such as having an incompetent cervix. This is when the tissue in the cervix is not strong enough to hold the pregnancy.    Problems with the uterus, such as an abnormally shaped uterus, uterine fibroids, or congenital abnormalities.    Certain medical conditions.    Smoking, drinking alcohol, or taking illegal drugs.    Trauma.   Often, the cause of a miscarriage is unknown.   SYMPTOMS    Vaginal bleeding or spotting, with or without cramps or pain.   Pain or cramping in the abdomen or lower back.   Passing fluid, tissue, or blood clots from the vagina.  DIAGNOSIS   Your caregiver will perform a physical exam. You may also have an ultrasound to confirm the miscarriage. Blood or urine tests may also be ordered.  TREATMENT    Sometimes, treatment is not necessary if you naturally pass all the fetal tissue that was in the uterus. If some of the fetus or placenta remains in the body (incomplete miscarriage), tissue left behind may become infected and must be removed. Usually, a dilation and curettage (D and C) procedure is performed.  During a D and C procedure, the cervix is widened (dilated) and any remaining fetal or placental tissue is gently removed from the uterus.   Antibiotic medicines are prescribed if there is an infection. Other medicines may be given to reduce the size of the uterus (contract) if there is a lot of bleeding.   If you have Rh negative blood and your baby was Rh positive, you will need a Rh immunoglobulin shot. This shot will protect any future baby from having Rh blood problems in future pregnancies.  HOME CARE INSTRUCTIONS    Your caregiver may order bed rest or may allow you to continue light activity. Resume activity as directed by your caregiver.   Have someone help with home and family responsibilities during this time.    Keep track of the number of sanitary pads you use each day and how soaked (saturated) they are. Write down this information.    Do not use tampons. Do not douche or have sexual intercourse until approved by your caregiver.    Only take over-the-counter or prescription medicines for pain or discomfort as directed by your caregiver.    Do not take aspirin. Aspirin can cause bleeding.    Keep all follow-up appointments with your caregiver.    If you or your partner have problems with grieving, talk to your caregiver or seek counseling to help cope with the pregnancy loss. Allow enough time to grieve before trying to get pregnant again.     SEEK IMMEDIATE MEDICAL CARE IF:    You have severe cramps or pain in your back or abdomen.   You have a fever.   You pass large blood clots (walnut-sized or larger) ortissue from your vagina. Save any tissue for your caregiver to inspect.    Your bleeding increases.    You have a thick, bad-smelling vaginal discharge.   You become lightheaded, weak, or you faint.    You have chills.   MAKE SURE YOU:   Understand these instructions.   Will watch your condition.   Will get help right away if you are not doing well or get worse.     This  information is not intended to replace advice given to you by your health care provider. Make sure you discuss any questions you have with your health care provider.     Document Released: 09/15/2000 Document Revised: 07/17/2012 Document Reviewed: 05/11/2011  Elsevier Interactive Patient Education 2016 [employer]

## 2015-10-21 ENCOUNTER — Other Ambulatory Visit: Payer: BLUE CROSS/BLUE SHIELD

## 2015-10-21 ENCOUNTER — Telehealth: Payer: Self-pay

## 2015-10-21 DIAGNOSIS — O3680X0 Pregnancy with inconclusive fetal viability, not applicable or unspecified: Secondary | ICD-10-CM

## 2015-10-21 LAB — HCG, QUANTITATIVE, PREGNANCY: hCG, Beta Chain, Quant, S: 485 m[IU]/mL — ABNORMAL HIGH (ref <5)

## 2015-10-21 NOTE — Telephone Encounter (Signed)
Patient presented to clinic today for a repeat hCG. Danielle Cisneros confirms today she is possibly having a miscarriage at this time. Patient complains of some bleeding but no pain at this time. I have reviewed signs and symptoms for patient to be on the look for. Patient verbalizes understanding and will follow up for a repeat in beta next week.

## 2015-10-21 NOTE — Progress Notes (Signed)
Patient presented today for a stat HCG level. Quant has continued to decrease. Per Dr.Stinson she is likely having a miscarriage. Patient stated she is having some bleeding but she has not pass any large clots. Patient was advised to come to MAU if she should develop a large amount of bleeding going through 1-2 pads per hour. Patient verbalizes understanding and will follow up for another quant in one week.

## 2016-04-05 NOTE — L&D Delivery Note (Signed)
Delivery Note   Rossi, Silvestro Girl A Shaelee [[id number]]  At 9:20 PM a viable female was delivered via Vaginal, Spontaneous Delivery (Presentation: vertex, OA).  APGAR: 9, 9; weight 4#11oz.    Anesthesia:  epidural Episiotomy: None Lacerations: 1st degree Suture Repair: 3.0 Est. Blood Loss (mL): 800    Najwa, Spillane [[id number]]  At 9:30 PM a viable female was delivered via Vaginal, Spontaneous Delivery (Presentation: frank breech). After delivery of baby A, [place] was used to confirm breech. I reached inside and followed the legs down to the foot and extracted the first foot. I then attempted to find the second foot, but was unable to find it. The patient had another contraction and the baby began to descend into the vaginal canal. The hips came to the introitus and I was able to reduce the second leg. I then proceeded with the breech extraction in the normal fashion. APGAR: 7, 9; weight 4#4oz.   Placenta status: delivered, spontaneously, intact.  Cord: 3VC  with the following complications: none.  Anesthesia: epidural  Episiotomy: None Lacerations: 1st degree  Suture Repair: 3.0 vicryl Est. Blood Loss (mL): 800   Mom to postpartum.   Baby A to NICU.   Baby B to NICU.  Truett Mainland 09/28/2016, 9:56 PM

## 2016-04-13 ENCOUNTER — Encounter: Payer: Self-pay | Admitting: Certified Nurse Midwife

## 2016-04-13 ENCOUNTER — Encounter: Payer: Self-pay | Admitting: *Deleted

## 2016-04-13 ENCOUNTER — Other Ambulatory Visit (INDEPENDENT_AMBULATORY_CARE_PROVIDER_SITE_OTHER): Payer: BLUE CROSS/BLUE SHIELD

## 2016-04-13 ENCOUNTER — Ambulatory Visit (INDEPENDENT_AMBULATORY_CARE_PROVIDER_SITE_OTHER): Payer: BLUE CROSS/BLUE SHIELD | Admitting: Certified Nurse Midwife

## 2016-04-13 ENCOUNTER — Other Ambulatory Visit: Payer: Self-pay | Admitting: Certified Nurse Midwife

## 2016-04-13 VITALS — BP 100/62 | HR 80 | Temp 98.7°F | Wt 231.8 lb

## 2016-04-13 DIAGNOSIS — O09291 Supervision of pregnancy with other poor reproductive or obstetric history, first trimester: Secondary | ICD-10-CM

## 2016-04-13 DIAGNOSIS — Z3490 Encounter for supervision of normal pregnancy, unspecified, unspecified trimester: Secondary | ICD-10-CM

## 2016-04-13 DIAGNOSIS — Z113 Encounter for screening for infections with a predominantly sexual mode of transmission: Secondary | ICD-10-CM | POA: Diagnosis not present

## 2016-04-13 DIAGNOSIS — Z8759 Personal history of other complications of pregnancy, childbirth and the puerperium: Secondary | ICD-10-CM

## 2016-04-13 DIAGNOSIS — O3680X0 Pregnancy with inconclusive fetal viability, not applicable or unspecified: Secondary | ICD-10-CM

## 2016-04-13 DIAGNOSIS — O09299 Supervision of pregnancy with other poor reproductive or obstetric history, unspecified trimester: Secondary | ICD-10-CM

## 2016-04-13 DIAGNOSIS — O0990 Supervision of high risk pregnancy, unspecified, unspecified trimester: Secondary | ICD-10-CM | POA: Insufficient documentation

## 2016-04-13 DIAGNOSIS — O99211 Obesity complicating pregnancy, first trimester: Secondary | ICD-10-CM

## 2016-04-13 DIAGNOSIS — Z3491 Encounter for supervision of normal pregnancy, unspecified, first trimester: Secondary | ICD-10-CM | POA: Diagnosis not present

## 2016-04-13 DIAGNOSIS — O30001 Twin pregnancy, unspecified number of placenta and unspecified number of amniotic sacs, first trimester: Secondary | ICD-10-CM

## 2016-04-13 DIAGNOSIS — Z124 Encounter for screening for malignant neoplasm of cervix: Secondary | ICD-10-CM

## 2016-04-13 DIAGNOSIS — O30009 Twin pregnancy, unspecified number of placenta and unspecified number of amniotic sacs, unspecified trimester: Secondary | ICD-10-CM | POA: Insufficient documentation

## 2016-04-13 DIAGNOSIS — O26899 Other specified pregnancy related conditions, unspecified trimester: Secondary | ICD-10-CM

## 2016-04-13 DIAGNOSIS — O26891 Other specified pregnancy related conditions, first trimester: Secondary | ICD-10-CM

## 2016-04-13 DIAGNOSIS — Z6791 Unspecified blood type, Rh negative: Secondary | ICD-10-CM | POA: Insufficient documentation

## 2016-04-13 DIAGNOSIS — O0991 Supervision of high risk pregnancy, unspecified, first trimester: Secondary | ICD-10-CM

## 2016-04-13 DIAGNOSIS — E6609 Other obesity due to excess calories: Secondary | ICD-10-CM

## 2016-04-13 DIAGNOSIS — R51 Headache: Secondary | ICD-10-CM

## 2016-04-13 MED ORDER — BUTALBITAL-APAP-CAFFEINE 50-325-40 MG PO TABS: 1.0000 | ORAL_TABLET | Freq: Four times a day (QID) | ORAL | 4 refills | Status: DC | PRN

## 2016-04-13 NOTE — Progress Notes (Signed)
Subjective:    Danielle Cisneros is being seen today for her first obstetrical visit.  This is a planned pregnancy. She is at [redacted]w[redacted]d gestation. Her obstetrical history is significant for obesity and recent miscarriage. Relationship with FOB: spouse, living together. Patient does intend to breast feed. Pregnancy history fully reviewed.  The information documented in the HPI was reviewed and verified.  Menstrual History: OB History    Gravida Para Term Preterm AB Living   2 0 0   1 0   SAB TAB Ectopic Multiple Live Births   1              Obstetric Comments   SAB July 2017.        Patient's last menstrual period was 01/28/2016.    Past Medical History:  Diagnosis Date  . Medical history non-contributory     Past Surgical History:  Procedure Laterality Date  . NO PAST SURGERIES       (Not in a hospital admission) No Known Allergies  Social History  Substance Use Topics  . Smoking status: Never Smoker  . Smokeless tobacco: Never Used  . Alcohol use No    No family history on file.   Review of Systems Constitutional: negative for weight loss Gastrointestinal: negative for vomiting Genitourinary:negative for genital lesions and vaginal discharge and dysuria Musculoskeletal:negative for back pain Behavioral/Psych: negative for abusive relationship, depression, illegal drug usage and tobacco use    Objective:    BP 100/62   Pulse 80   Temp 98.7 F (37.1 C)   Wt 231 lb 12.8 oz (105.1 kg)   LMP 01/28/2016   BMI 42.40 kg/m  General Appearance:    Alert, cooperative, no distress, appears stated age  Head:    Normocephalic, without obvious abnormality, atraumatic  Eyes:    PERRL, conjunctiva/corneas clear, EOM's intact, fundi    benign, both eyes  Ears:    Normal TM's and external ear canals, both ears  Nose:   Nares normal, septum midline, mucosa normal, no drainage    or sinus tenderness  Throat:   Lips, mucosa, and tongue normal; teeth and gums normal  Neck:   Supple,  symmetrical, trachea midline, no adenopathy;    thyroid:  no enlargement/tenderness/nodules; no carotid   bruit or JVD  Back:     Symmetric, no curvature, ROM normal, no CVA tenderness  Lungs:     Clear to auscultation bilaterally, respirations unlabored  Chest Wall:    No tenderness or deformity   Heart:    Regular rate and rhythm, S1 and S2 normal, no murmur, rub   or gallop  Breast Exam:    No tenderness, masses, or nipple abnormality  Abdomen:     Soft, non-tender, bowel sounds active all four quadrants,    no masses, no organomegaly  Genitalia:    Normal female without lesion, discharge or tenderness  Extremities:   Extremities normal, atraumatic, no cyanosis or edema  Pulses:   2+ and symmetric all extremities  Skin:   Skin color, texture, turgor normal, no rashes or lesions  Lymph nodes:   Cervical, supraclavicular, and axillary nodes normal  Neurologic:   CNII-XII intact, normal strength, sensation and reflexes    throughout       Cervix:   Long, thick, closed and posterior.   FHR: 160/151 by doppler          FH: less than U.   In office [place]: twin pregnancy, ?mono versus di   Lab Review  Urine pregnancy test Labs reviewed yes Radiologic studies reviewed yes Assessment:    Pregnancy at [redacted]w[redacted]d weeks   Recent SAB  Twin pregnancy  Plan:     Prenatal vitamins.  Counseling provided regarding continued use of seat belts, cessation of alcohol consumption, smoking or use of illicit drugs; infection precautions i.e., influenza/TDAP immunizations, toxoplasmosis,CMV, parvovirus, listeria and varicella; workplace safety, exercise during pregnancy; routine dental care, safe medications, sexual activity, hot tubs, saunas, pools, travel, caffeine use, fish and methlymercury, potential toxins, hair treatments, varicose veins Weight gain recommendations per IOM guidelines reviewed: underweight/BMI< 18.5--> gain 28 - 40 lbs; normal weight/BMI 18.5 - 24.9--> gain 25 - 35 lbs; overweight/BMI 25 -  29.9--> gain 15 - 25 lbs; obese/BMI >30->gain  11 - 20 lbs Problem list reviewed and updated. FIRST/CF mutation testing/NIPT/QUAD SCREEN/fragile X/Ashkenazi Jewish population testing/Spinal muscular atrophy discussed: ordered. Role of ultrasound in pregnancy discussed; fetal survey: requested. Amniocentesis discussed: not indicated. VBAC calculator score: VBAC consent form provided Meds ordered this encounter  Medications  . butalbital-acetaminophen-caffeine (FIORICET, ESGIC) 50-325-40 MG tablet    Sig: Take 1-2 tablets by mouth every 6 (six) hours as needed.    Dispense:  45 tablet    Refill:  4   Orders Placed This Encounter  Procedures  . Culture, OB Urine  . US OB Comp Less 14 Wks    Standing Status:   Future    Number of Occurrences:   1    Standing Expiration Date:   06/11/2017    Order Specific Question:   Reason for Exam (SYMPTOM  OR DIAGNOSIS REQUIRED)    Answer:   dating, possible twins    Order Specific Question:   Preferred imaging location?    Answer:   Internal  . [place] MFM OB Comp Addl Gest Less 14 Wks    Standing Status:   Future    Standing Expiration Date:   06/11/2017    Order Specific Question:   Reason for Exam (SYMPTOM  OR DIAGNOSIS REQUIRED)    Answer:   twins ?mono, fiborids multiple    Order Specific Question:   Preferred imaging location?    Answer:   [facility]-Ultrasound  . [place] MFM OB Comp Less 14 Wks    Standing Status:   Future    Standing Expiration Date:   06/11/2017    Order Specific Question:   Reason for Exam (SYMPTOM  OR DIAGNOSIS REQUIRED)    Answer:   twins ?mono  fibroids    Order Specific Question:   Preferred imaging location?    Answer:   [facility]-Ultrasound  . TSH  . Hemoglobinopathy evaluation  . Varicella zoster antibody, IgG  . MaterniT21 PLUS Core+SCA    Order Specific Question:   Is the patient insulin dependent?    Answer:   No    Order Specific Question:   Please enter gestational age. This should be expressed as weeks AND days, i.e. 16w 6d.  Enter weeks here. Enter days in next question.    Answer:   7    Order Specific Question:   Please enter gestational age. This should be expressed as weeks AND days, i.e. 16w 6d. Enter days here. Enter weeks in previous question.    Answer:   6    Order Specific Question:   How was gestational age calculated?    Answer:   LMP    Order Specific Question:   Please give the date of LMP OR Ultrasound OR Estimated date of delivery.  Answer:   11/03/2016    Order Specific Question:   Number of Fetuses (Type of Pregnancy):    Answer:   1    Order Specific Question:   Indications for performing the test? (please choose all that apply):    Answer:   Routine screening    Order Specific Question:   Other Indications? (Y=Yes, N=No)    Answer:   N    Order Specific Question:   If this is a repeat specimen, please indicate the reason:    Answer:   Not indicated    Order Specific Question:   Please specify the patient's race: (C=White/Caucasion, B=Black, I=Native American, A=Asian, H=Hispanic, O=Other, U=Unknown)    Answer:   B    Order Specific Question:   Donor Egg - indicate if the egg was obtained from in vitro fertilization.    Answer:   N    Order Specific Question:   Age of Egg Donor.    Answer:   54    Order Specific Question:   Prior Down Syndrome/ONTD screening during current pregnancy.    Answer:   N    Order Specific Question:   Prior First Trimester Testing    Answer:   N    Order Specific Question:   Prior Second Trimester Testing    Answer:   N    Order Specific Question:   Family History of Neural Tube Defects    Answer:   N    Order Specific Question:   Prior Pregnancy with Down Syndrome    Answer:   N    Order Specific Question:   Please give the patient's weight (in pounds)    Answer:   232  . ToxASSURE Select 13 (MW), Urine  . Hemoglobin A1c  . Obstetric Panel, Including HIV  . Cystic Fibrosis Mutation 97    Follow up in 4 weeks. 50% of 30 min visit spent on counseling  and coordination of care.

## 2016-04-15 ENCOUNTER — Other Ambulatory Visit: Payer: Self-pay | Admitting: Certified Nurse Midwife

## 2016-04-15 DIAGNOSIS — O0991 Supervision of high risk pregnancy, unspecified, first trimester: Secondary | ICD-10-CM

## 2016-04-15 LAB — CERVICOVAGINAL ANCILLARY ONLY
BACTERIAL VAGINITIS: POSITIVE — AB
Candida vaginitis: NEGATIVE
Chlamydia: NEGATIVE
NEISSERIA GONORRHEA: NEGATIVE
Trichomonas: NEGATIVE

## 2016-04-15 LAB — CULTURE, OB URINE

## 2016-04-15 LAB — CYTOLOGY - PAP
ADEQUACY: ABSENT
DIAGNOSIS: NEGATIVE

## 2016-04-15 LAB — URINE CULTURE, OB REFLEX

## 2016-04-19 ENCOUNTER — Other Ambulatory Visit: Payer: Self-pay | Admitting: Certified Nurse Midwife

## 2016-04-19 DIAGNOSIS — N760 Acute vaginitis: Principal | ICD-10-CM

## 2016-04-19 DIAGNOSIS — B9689 Other specified bacterial agents as the cause of diseases classified elsewhere: Secondary | ICD-10-CM

## 2016-04-19 LAB — HEMOGLOBINOPATHY EVALUATION
HGB C: 0 %
HGB S: 0 %
HGB VARIANT: 28.6 %
Hemoglobin A2 Quantitation: 4.1 % — ABNORMAL HIGH (ref 1.8–3.2)
Hemoglobin F Quantitation: 0 % (ref 0.0–2.0)
Hgb A: 67.3 % — ABNORMAL LOW (ref 96.4–98.8)

## 2016-04-19 LAB — OBSTETRIC PANEL, INCLUDING HIV
Antibody Screen: NEGATIVE
Basophils Absolute: 0 10*3/uL (ref 0.0–0.2)
Basos: 0 %
EOS (ABSOLUTE): 0.1 10*3/uL (ref 0.0–0.4)
Eos: 1 %
HIV Screen 4th Generation wRfx: NONREACTIVE
Hematocrit: 33.9 % — ABNORMAL LOW (ref 34.0–46.6)
Hemoglobin: 10.8 g/dL — ABNORMAL LOW (ref 11.1–15.9)
Hepatitis B Surface Ag: NEGATIVE
Immature Grans (Abs): 0 10*3/uL (ref 0.0–0.1)
Immature Granulocytes: 0 %
Lymphocytes Absolute: 4.2 10*3/uL — ABNORMAL HIGH (ref 0.7–3.1)
Lymphs: 43 %
MCH: 24.3 pg — ABNORMAL LOW (ref 26.6–33.0)
MCHC: 31.9 g/dL (ref 31.5–35.7)
MCV: 76 fL — ABNORMAL LOW (ref 79–97)
Monocytes Absolute: 0.6 10*3/uL (ref 0.1–0.9)
Monocytes: 6 %
Neutrophils Absolute: 4.8 10*3/uL (ref 1.4–7.0)
Neutrophils: 50 %
Platelets: 312 10*3/uL (ref 150–379)
RBC: 4.44 x10E6/uL (ref 3.77–5.28)
RDW: 16.3 % — ABNORMAL HIGH (ref 12.3–15.4)
RPR Ser Ql: NONREACTIVE
Rh Factor: NEGATIVE
Rubella Antibodies, IGG: 3.94 index (ref >0.99)
WBC: 9.7 10*3/uL (ref 3.4–10.8)

## 2016-04-19 LAB — CYSTIC FIBROSIS MUTATION 97: GENE DIS ANAL CARRIER INTERP BLD/T-IMP: NOT DETECTED

## 2016-04-19 LAB — VARICELLA ZOSTER ANTIBODY, IGG: Varicella zoster IgG: 854 index (ref >165)

## 2016-04-19 LAB — TSH: TSH: 3.66 u[IU]/mL (ref 0.450–4.500)

## 2016-04-19 LAB — HEMOGLOBIN A1C
Est. average glucose Bld gHb Est-mCnc: 105 mg/dL
HEMOGLOBIN A1C: 5.3 % (ref 4.8–5.6)

## 2016-04-19 MED ORDER — METRONIDAZOLE 0.75 % VA GEL: 1.0000 | Freq: Two times a day (BID) | VAGINAL | 0 refills | Status: DC

## 2016-04-20 ENCOUNTER — Telehealth: Payer: Self-pay

## 2016-04-20 LAB — TOXASSURE SELECT 13 (MW), URINE

## 2016-04-20 NOTE — Telephone Encounter (Signed)
Please call and let her know that her labs look good. She does have BV. Metrogel was sent to the pharmacy for her to use. Thank you. R.Denney CNM  LVM for pt to c/b

## 2016-04-21 LAB — MATERNIT21 PLUS CORE+SCA
Chromosome 13: NEGATIVE
Chromosome 18: NEGATIVE
Chromosome 21: NEGATIVE
PDF: 0
Y Chromosome: NOT DETECTED

## 2016-04-22 ENCOUNTER — Other Ambulatory Visit: Payer: Self-pay | Admitting: Certified Nurse Midwife

## 2016-04-22 DIAGNOSIS — O0991 Supervision of high risk pregnancy, unspecified, first trimester: Secondary | ICD-10-CM

## 2016-04-22 NOTE — Telephone Encounter (Signed)
2nd attempt unable to reach pt by phone. Pt notified test results via My Chart.

## 2016-05-11 ENCOUNTER — Ambulatory Visit (INDEPENDENT_AMBULATORY_CARE_PROVIDER_SITE_OTHER): Payer: BLUE CROSS/BLUE SHIELD | Admitting: Certified Nurse Midwife

## 2016-05-11 VITALS — BP 93/64 | HR 74 | Wt 225.0 lb

## 2016-05-11 DIAGNOSIS — O30002 Twin pregnancy, unspecified number of placenta and unspecified number of amniotic sacs, second trimester: Secondary | ICD-10-CM

## 2016-05-11 DIAGNOSIS — O0992 Supervision of high risk pregnancy, unspecified, second trimester: Secondary | ICD-10-CM

## 2016-05-11 DIAGNOSIS — Z23 Encounter for immunization: Secondary | ICD-10-CM | POA: Diagnosis not present

## 2016-05-11 DIAGNOSIS — Z6791 Unspecified blood type, Rh negative: Secondary | ICD-10-CM

## 2016-05-11 DIAGNOSIS — O26899 Other specified pregnancy related conditions, unspecified trimester: Secondary | ICD-10-CM

## 2016-05-11 DIAGNOSIS — R87610 Atypical squamous cells of undetermined significance on cytologic smear of cervix (ASC-US): Secondary | ICD-10-CM

## 2016-05-11 NOTE — Progress Notes (Signed)
   PRENATAL VISIT NOTE  Subjective:  Danielle Cisneros is a 26 y.o. G2P0010 at [redacted]w[redacted]d being seen today for ongoing prenatal care.  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  Patient reports no complaints.  Contractions: Not present. Vag. Bleeding: None.  Movement: Absent. Denies leaking of fluid.   The following portions of the patient's history were reviewed and updated as appropriate: allergies, current medications, past family history, past medical history, past social history, past surgical history and problem list. Problem list updated.  Objective:   Vitals:   05/11/16 1033  BP: 93/64  Pulse: 74  Weight: 225 lb (102.1 kg)    Fetal Status:     Movement: Absent   FHR: A: 140, B: 154, FH: @U .   General:  Alert, oriented and cooperative. Patient is in no acute distress.  Skin: Skin is warm and dry. No rash noted.   Cardiovascular: Normal heart rate noted  Respiratory: Normal respiratory effort, no problems with respiration noted  Abdomen: Soft, gravid, appropriate for gestational age. Pain/Pressure: Absent     Pelvic:  Cervical exam deferred        Extremities: Normal range of motion.     Mental Status: Normal mood and affect. Normal behavior. Normal judgment and thought content.   Assessment and Plan:  Pregnancy: G2P0010 at [redacted]w[redacted]d  1. Rh negative state in antepartum period     Rhogam at 28 weeks  2. Supervision of high risk pregnancy in second trimester     Doing well.  [place] scheduled for twin pregnancy  - Flu Vaccine QUAD 36+ mos IM (Fluarix, Quad PF)  3. Twin gestation in second trimester, unspecified multiple gestation type      4. Morbid obesity ([place])      5. Atypical squamous cell of undetermined  significance of cervix     Colposcopy postpartum  Preterm labor symptoms and general obstetric precautions including but not limited to vaginal bleeding, contractions, leaking of fluid and fetal movement were reviewed in detail with the patient. Please refer to After Visit Summary for other counseling recommendations.  Return in about 4 weeks (around 06/08/2016) for [facility].   Morene Crocker, CNM

## 2016-05-25 ENCOUNTER — Encounter (HOSPITAL_COMMUNITY): Payer: Self-pay | Admitting: Certified Nurse Midwife

## 2016-06-04 ENCOUNTER — Other Ambulatory Visit: Payer: Self-pay | Admitting: Certified Nurse Midwife

## 2016-06-04 ENCOUNTER — Ambulatory Visit (HOSPITAL_COMMUNITY)
Admission: RE | Admit: 2016-06-04 | Discharge: 2016-06-04 | Disposition: A | Discharge status: Home / Self Care | Payer: BLUE CROSS/BLUE SHIELD | Source: Ambulatory Visit | Attending: Certified Nurse Midwife | Admitting: Certified Nurse Midwife

## 2016-06-04 ENCOUNTER — Encounter (HOSPITAL_COMMUNITY): Payer: Self-pay

## 2016-06-04 VITALS — BP 100/45 | HR 70 | Wt 224.2 lb

## 2016-06-04 DIAGNOSIS — Z363 Encounter for antenatal screening for malformations: Secondary | ICD-10-CM | POA: Insufficient documentation

## 2016-06-04 DIAGNOSIS — Z3A18 18 weeks gestation of pregnancy: Secondary | ICD-10-CM | POA: Insufficient documentation

## 2016-06-04 DIAGNOSIS — O99212 Obesity complicating pregnancy, second trimester: Secondary | ICD-10-CM

## 2016-06-04 DIAGNOSIS — Z6791 Unspecified blood type, Rh negative: Secondary | ICD-10-CM | POA: Insufficient documentation

## 2016-06-04 DIAGNOSIS — O30002 Twin pregnancy, unspecified number of placenta and unspecified number of amniotic sacs, second trimester: Secondary | ICD-10-CM

## 2016-06-04 DIAGNOSIS — O30001 Twin pregnancy, unspecified number of placenta and unspecified number of amniotic sacs, first trimester: Secondary | ICD-10-CM

## 2016-06-04 DIAGNOSIS — O30032 Twin pregnancy, monochorionic/diamniotic, second trimester: Secondary | ICD-10-CM | POA: Insufficient documentation

## 2016-06-04 DIAGNOSIS — O3412 Maternal care for benign tumor of corpus uteri, second trimester: Secondary | ICD-10-CM

## 2016-06-04 DIAGNOSIS — O26892 Other specified pregnancy related conditions, second trimester: Secondary | ICD-10-CM

## 2016-06-04 DIAGNOSIS — D259 Leiomyoma of uterus, unspecified: Secondary | ICD-10-CM | POA: Insufficient documentation

## 2016-06-04 DIAGNOSIS — O0991 Supervision of high risk pregnancy, unspecified, first trimester: Secondary | ICD-10-CM

## 2016-06-04 DIAGNOSIS — O26899 Other specified pregnancy related conditions, unspecified trimester: Secondary | ICD-10-CM

## 2016-06-04 DIAGNOSIS — O30019 Twin pregnancy, monochorionic/monoamniotic, unspecified trimester: Secondary | ICD-10-CM | POA: Insufficient documentation

## 2016-06-04 DIAGNOSIS — O30012 Twin pregnancy, monochorionic/monoamniotic, second trimester: Secondary | ICD-10-CM

## 2016-06-04 NOTE — Progress Notes (Signed)
Maternal Fetal Medicine Consultation  Requesting Provider(s): [facility]  Primary Ob: [facility]  Reason for consultation: monochorionic diamniotic twins  HPI: 26yo P0010 at 18+2 weeks with twins, suspected mono/di . No problems with this pregnancy so far, she reports good fetal movement and no bleeding. She has done resarch on the internet and has a good understanding of general twin risks buwas unaware of the risks of twin to twin transfusion OB History: OB History    Gravida Para Term Preterm AB Living   2 0 0   1 0   SAB TAB Ectopic Multiple Live Births   1              Obstetric Comments   SAB July 2017.       PMH:  Past Medical History:  Diagnosis Date  . Medical history non-contributory     PSH:  Past Surgical History:  Procedure Laterality Date  . NO PAST SURGERIES     Meds: See EPIC section Allergies: See EPIC section Fh: See EPIC section Soc: See EPIC section  Review of Systems: no vaginal bleeding or cramping/contractions, no LOF, no nausea/vomiting. All other systems reviewed and are negative.  Pe: See EPIC section    Please see separate document for fetal ultrasound report.  A/P: Monochorionic diamniotic twins We discussed TTTS at length. We will plan evaluations every 2 weeks with a growth evaluation every 4 weeks. We will check cervical length through 26 weeks. She will start 81mg  Asprin once daily today. We went over dietary goals, including 3000-3200 cal/day, 20% protein, 40% fat, 40% low-glycemic index carbohydrates. I would plan a 1hr GTT early, at 24-26 weeks. At this time no other changes are necessary in the obsterical regimen. Would plan delivery between 36-37 weeks 2. Uterine fibroids At this time they are asymptomatic. The LUS fibroid might interfere with vaginal delivery, but this is a clinical not an [place] call to be made at term  Thank you for the opportunity to be a part of the care of Niala Gencarelli. Please contact our office if we can be of further  assistance.   I spent approximately 30 minutes with this patient with over 50% of time spent in face-to-face counseling.

## 2016-06-08 ENCOUNTER — Ambulatory Visit (INDEPENDENT_AMBULATORY_CARE_PROVIDER_SITE_OTHER): Payer: BLUE CROSS/BLUE SHIELD | Admitting: Obstetrics and Gynecology

## 2016-06-08 VITALS — BP 103/67 | HR 85 | Wt 224.0 lb

## 2016-06-08 DIAGNOSIS — D259 Leiomyoma of uterus, unspecified: Secondary | ICD-10-CM

## 2016-06-08 DIAGNOSIS — O30039 Twin pregnancy, monochorionic/diamniotic, unspecified trimester: Secondary | ICD-10-CM | POA: Insufficient documentation

## 2016-06-08 DIAGNOSIS — O26899 Other specified pregnancy related conditions, unspecified trimester: Secondary | ICD-10-CM

## 2016-06-08 DIAGNOSIS — O0992 Supervision of high risk pregnancy, unspecified, second trimester: Secondary | ICD-10-CM

## 2016-06-08 DIAGNOSIS — O30032 Twin pregnancy, monochorionic/diamniotic, second trimester: Secondary | ICD-10-CM

## 2016-06-08 DIAGNOSIS — O3412 Maternal care for benign tumor of corpus uteri, second trimester: Secondary | ICD-10-CM

## 2016-06-08 DIAGNOSIS — Z6791 Unspecified blood type, Rh negative: Secondary | ICD-10-CM

## 2016-06-08 NOTE — Progress Notes (Signed)
Subjective:  Danielle Cisneros is a 26 y.o. G2P0010 at [redacted]w[redacted]d being seen today for ongoing prenatal care.  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  Patient reports no complaints.  Contractions: Not present.  .  Movement: Present. Denies leaking of fluid.   The following portions of the patient's history were reviewed and updated as appropriate: allergies, current medications, past family history, past medical history, past social history, past surgical history and problem list. Problem list updated.  Objective:   Vitals:   06/08/16 1023  BP: 103/67  Pulse: 85  Weight: 224 lb (101.6 kg)    Fetal Status:     Movement: Present     General:  Alert, oriented and cooperative. Patient is in no acute distress.  Skin: Skin is warm and dry. No rash noted.   Cardiovascular: Normal heart rate noted  Respiratory: Normal respiratory effort, no problems with respiration noted  Abdomen: Soft, gravid, appropriate for gestational age. Pain/Pressure: Absent     Pelvic:  Cervical exam deferred        Extremities: Normal range of motion.     Mental Status: Normal mood and affect. Normal behavior. Normal judgment and thought content.   Urinalysis: Urine Protein: Trace Urine Glucose: Negative  Assessment and Plan:  Pregnancy: G2P0010 at [redacted]w[redacted]d  1. Supervision of high risk pregnancy in second trimester   2. Rh negative state in antepartum period   3. Monochorionic diamniotic twin gestation in second trimester Has seen MFM. U/S for CL and growth scans scheduled BASA qd  FHT'S by U/S 4. Uterine fibroid complicating antenatal care, baby not yet  delivered in second trimester   Preterm labor symptoms and general obstetric precautions including but not limited to vaginal bleeding, contractions, leaking of fluid and fetal movement were reviewed in detail with the patient. Please refer to After Visit Summary for other counseling recommendations.  Return in about 4 weeks (around 07/06/2016) for OB visit.   Chancy Milroy, MD

## 2016-06-09 ENCOUNTER — Other Ambulatory Visit: Payer: Self-pay | Admitting: Certified Nurse Midwife

## 2016-06-09 DIAGNOSIS — D259 Leiomyoma of uterus, unspecified: Secondary | ICD-10-CM

## 2016-06-09 DIAGNOSIS — O3412 Maternal care for benign tumor of corpus uteri, second trimester: Principal | ICD-10-CM

## 2016-06-18 ENCOUNTER — Ambulatory Visit (HOSPITAL_COMMUNITY)
Admission: RE | Admit: 2016-06-18 | Discharge: 2016-06-18 | Disposition: A | Discharge status: Home / Self Care | Payer: BLUE CROSS/BLUE SHIELD | Source: Ambulatory Visit | Attending: Certified Nurse Midwife | Admitting: Certified Nurse Midwife

## 2016-06-18 ENCOUNTER — Encounter (HOSPITAL_COMMUNITY): Payer: Self-pay

## 2016-06-18 ENCOUNTER — Other Ambulatory Visit (HOSPITAL_COMMUNITY): Payer: Self-pay | Admitting: Obstetrics and Gynecology

## 2016-06-18 DIAGNOSIS — O30032 Twin pregnancy, monochorionic/diamniotic, second trimester: Secondary | ICD-10-CM | POA: Insufficient documentation

## 2016-06-18 DIAGNOSIS — O30019 Twin pregnancy, monochorionic/monoamniotic, unspecified trimester: Secondary | ICD-10-CM

## 2016-06-18 DIAGNOSIS — Z3A20 20 weeks gestation of pregnancy: Secondary | ICD-10-CM | POA: Diagnosis not present

## 2016-06-18 DIAGNOSIS — O360120 Maternal care for anti-D [Rh] antibodies, second trimester, not applicable or unspecified: Secondary | ICD-10-CM | POA: Diagnosis not present

## 2016-06-18 DIAGNOSIS — O30012 Twin pregnancy, monochorionic/monoamniotic, second trimester: Secondary | ICD-10-CM | POA: Diagnosis present

## 2016-06-18 DIAGNOSIS — O99212 Obesity complicating pregnancy, second trimester: Secondary | ICD-10-CM | POA: Diagnosis not present

## 2016-07-02 ENCOUNTER — Ambulatory Visit (HOSPITAL_COMMUNITY)
Admission: RE | Admit: 2016-07-02 | Discharge: 2016-07-02 | Disposition: A | Discharge status: Home / Self Care | Payer: BLUE CROSS/BLUE SHIELD | Source: Ambulatory Visit | Attending: Certified Nurse Midwife | Admitting: Certified Nurse Midwife

## 2016-07-02 ENCOUNTER — Encounter (HOSPITAL_COMMUNITY): Payer: Self-pay

## 2016-07-02 DIAGNOSIS — O30019 Twin pregnancy, monochorionic/monoamniotic, unspecified trimester: Secondary | ICD-10-CM

## 2016-07-02 DIAGNOSIS — O30032 Twin pregnancy, monochorionic/diamniotic, second trimester: Secondary | ICD-10-CM | POA: Insufficient documentation

## 2016-07-02 DIAGNOSIS — Z362 Encounter for other antenatal screening follow-up: Secondary | ICD-10-CM | POA: Insufficient documentation

## 2016-07-02 DIAGNOSIS — O30012 Twin pregnancy, monochorionic/monoamniotic, second trimester: Secondary | ICD-10-CM | POA: Insufficient documentation

## 2016-07-02 DIAGNOSIS — O360120 Maternal care for anti-D [Rh] antibodies, second trimester, not applicable or unspecified: Secondary | ICD-10-CM | POA: Diagnosis not present

## 2016-07-02 DIAGNOSIS — O99212 Obesity complicating pregnancy, second trimester: Secondary | ICD-10-CM | POA: Diagnosis not present

## 2016-07-02 DIAGNOSIS — Z3A22 22 weeks gestation of pregnancy: Secondary | ICD-10-CM | POA: Insufficient documentation

## 2016-07-07 ENCOUNTER — Ambulatory Visit (INDEPENDENT_AMBULATORY_CARE_PROVIDER_SITE_OTHER): Payer: BLUE CROSS/BLUE SHIELD | Admitting: Obstetrics & Gynecology

## 2016-07-07 VITALS — BP 116/74 | HR 80 | Wt 231.8 lb

## 2016-07-07 DIAGNOSIS — R87610 Atypical squamous cells of undetermined significance on cytologic smear of cervix (ASC-US): Secondary | ICD-10-CM

## 2016-07-07 DIAGNOSIS — O0992 Supervision of high risk pregnancy, unspecified, second trimester: Secondary | ICD-10-CM

## 2016-07-07 DIAGNOSIS — O30032 Twin pregnancy, monochorionic/diamniotic, second trimester: Secondary | ICD-10-CM

## 2016-07-07 NOTE — Progress Notes (Signed)
   PRENATAL VISIT NOTE  Subjective:  Danielle Cisneros is a 26 y.o. G2P0010 at [redacted]w[redacted]d being seen today for ongoing prenatal care.  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  Patient reports no complaints.  Contractions: Not present. Vag. Bleeding: None.  Movement: Present. Denies leaking of fluid.   The following portions of the patient's history were reviewed and updated as appropriate: allergies, current medications, past family history, past medical history, past social history, past surgical history and problem list. Problem list updated.  Objective:   Vitals:   07/07/16 1126  BP: 116/74  Pulse: 80  Weight: 231 lb 12.8 oz (105.1 kg)    Fetal Status: Fetal Heart Rate (bpm): A: 130 B: 138 Fundal Height: 28 cm Movement: Present     General:  Alert, oriented and cooperative. Patient is in no acute distress.  Skin: Skin is warm and dry. No rash noted.   Cardiovascular: Normal heart rate noted  Respiratory: Normal respiratory effort, no problems with respiration noted  Abdomen: Soft, gravid, appropriate for gestational age. Pain/Pressure: Absent     Pelvic:  Cervical exam deferred        Extremities: Normal range of motion.  Edema: None  Mental Status: Normal mood and affect. Normal behavior. Normal judgment and thought content.   Assessment and Plan:  Pregnancy: G2P0010 at [redacted]w[redacted]d  1. Supervision of high risk pregnancy in second trimester Growth [place] next week  2. Monochorionic diamniotic twin gestation in second trimester Normal growth on last [place]  3. 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 Please refer to After Visit Summary for other counseling recommendations.  Return in about 4 weeks (around 08/04/2016) for 2 hr gtt.   Woodroe Mode, MD

## 2016-07-07 NOTE — Patient Instructions (Signed)
Multiple Pregnancy Having a multiple pregnancy means that a woman is carrying more than one baby at a time. She may be pregnant with twins, triplets, or more. The majority of multiple pregnancies are twins. Naturally conceiving triplets or more (higher-order multiples) is rare. Multiple pregnancies are riskier than single pregnancies. A woman with a multiple pregnancy is more likely to have certain problems during her pregnancy. Therefore, she will need to have more frequent appointments for prenatal care. How does a multiple pregnancy happen? A multiple pregnancy happens when:  The woman's body releases more than one egg at a time, and then each egg gets fertilized by a different sperm.  This is the most common type of multiple pregnancy.  Twins or other multiples produced this way are fraternal. They are no more alike than non-multiple siblings are.  One sperm fertilizes one egg, which then divides into more than one embryo.  Twins or other multiples produced this way are identical. Identical multiples are always the same gender, and they look very much alike. Who is most likely to have a multiple pregnancy? A multiple pregnancy is more likely to develop in women who:  Have had fertility treatment, especially if the treatment included fertility drugs.  Are older than 28 years of age.  Have already had four or more children.  Have a family history of multiple pregnancy. How is a multiple pregnancy diagnosed? A multiple pregnancy may be diagnosed based on:  Symptoms such as:  Rapid weight gain in the first 3 months of pregnancy (first trimester).  More severe nausea and breast tenderness than what is typical of a single pregnancy.  The uterus measuring larger than what is normal for the stage of the pregnancy.  Blood tests that detect a higher-than-normal level of human chorionic gonadotropin (hCG). This is a hormone that your body produces in early pregnancy.  Ultrasound exam.  This is used to confirm that you are carrying multiples. What risks are associated with multiple pregnancy? A multiple pregnancy puts you at a higher risk for certain problems during or after your pregnancy, including:  Having your babies delivered before you have reached a full-term pregnancy (preterm birth). A full-term pregnancy lasts for at least 37 weeks. Babies born before 23 weeks may have a higher risk of a variety of health problems, such as breathing problems, feeding difficulties, cerebral palsy, and learning disabilities.  Diabetes.  Preeclampsia. This is a serious condition that causes high blood pressure along with other symptoms, such as swelling and headaches, during pregnancy.  Excessive blood loss after childbirth (postpartum hemorrhage).  Postpartum depression.  Low birth weight of the babies. How will having a multiple pregnancy affect my care? Your health care provider will want to monitor you more closely during your pregnancy to make sure that your babies are growing normally and that you are healthy. Follow these instructions at home: Because your pregnancy is considered to be high risk, you will need to work closely with your health care team. You may also need to make some lifestyle changes. These may include the following: Eating and drinking   Increase your nutrition.  Follow your health care provider's recommendations for weight gain. You may need to gain a little extra weight when you are pregnant with multiples.  Eat healthy snacks often throughout the day. This can add calories and reduce nausea.  Drink enough fluid to keep your urine clear or pale yellow.  Take prenatal vitamins. Activity  By 20-24 weeks, you may need to  limit your activities.  Avoid activities and work that take a lot of effort (are strenuous).  Ask your health care provider when you should stop having sexual intercourse.  Rest often. General instructions   Do not use any  products that contain nicotine or tobacco, such as cigarettes and e-cigarettes. If you need help quitting, ask your health care provider.  Do not drink alcohol or use illegal drugs.  Take over-the-counter and prescription medicines only as told by your health care provider.  Arrange for extra help around the house.  Keep all follow-up visits and all prenatal visits as told by your health care provider. This is important. Contact a health care provider if:  You have dizziness.  You have persistent nausea, vomiting, or diarrhea.  You are having trouble gaining weight.  You have feelings of depression or other emotions that are interfering with your normal activities. Get help right away if:  You have a fever.  You have pain with urination.  You have fluid leaking from your vagina.  You have a bad-smelling vaginal discharge.  You notice increased swelling in your face, hands, legs, or ankles.  You have spotting or bleeding from your vagina.  You have pelvic cramps, pelvic pressure, or nagging pain in your abdomen or lower back.  You are having regular contractions.  You develop a severe headache, with or without visual changes.  You have shortness of breath or chest pain.  You notice less fetal movement, or no fetal movement. This information is not intended to replace advice given to you by your health care provider. Make sure you discuss any questions you have with your health care provider. Document Released: 12/30/2007 Document Revised: 11/21/2015 Document Reviewed: 11/21/2015 Elsevier Interactive Patient Education  2017 [employer].

## 2016-07-07 NOTE — Progress Notes (Signed)
Patient reports good fetal movement, denies pain/contractions.

## 2016-07-08 ENCOUNTER — Encounter: Payer: BLUE CROSS/BLUE SHIELD | Admitting: Obstetrics & Gynecology

## 2016-07-16 ENCOUNTER — Ambulatory Visit (HOSPITAL_COMMUNITY)
Admission: RE | Admit: 2016-07-16 | Discharge: 2016-07-16 | Disposition: A | Discharge status: Home / Self Care | Payer: BLUE CROSS/BLUE SHIELD | Source: Ambulatory Visit | Attending: Certified Nurse Midwife | Admitting: Certified Nurse Midwife

## 2016-07-16 ENCOUNTER — Encounter (HOSPITAL_COMMUNITY): Payer: Self-pay

## 2016-07-16 DIAGNOSIS — O99212 Obesity complicating pregnancy, second trimester: Secondary | ICD-10-CM | POA: Insufficient documentation

## 2016-07-16 DIAGNOSIS — O360120 Maternal care for anti-D [Rh] antibodies, second trimester, not applicable or unspecified: Secondary | ICD-10-CM | POA: Insufficient documentation

## 2016-07-16 DIAGNOSIS — O30032 Twin pregnancy, monochorionic/diamniotic, second trimester: Secondary | ICD-10-CM | POA: Diagnosis not present

## 2016-07-16 DIAGNOSIS — Z362 Encounter for other antenatal screening follow-up: Secondary | ICD-10-CM | POA: Insufficient documentation

## 2016-07-16 DIAGNOSIS — Z3A24 24 weeks gestation of pregnancy: Secondary | ICD-10-CM | POA: Diagnosis not present

## 2016-07-16 DIAGNOSIS — O30019 Twin pregnancy, monochorionic/monoamniotic, unspecified trimester: Secondary | ICD-10-CM

## 2016-07-22 ENCOUNTER — Other Ambulatory Visit: Payer: Self-pay | Admitting: Certified Nurse Midwife

## 2016-07-26 ENCOUNTER — Encounter: Payer: Self-pay | Admitting: Physician Assistant

## 2016-07-26 ENCOUNTER — Encounter: Payer: Self-pay | Admitting: Certified Nurse Midwife

## 2016-07-30 ENCOUNTER — Other Ambulatory Visit (HOSPITAL_COMMUNITY): Payer: Self-pay | Admitting: Obstetrics and Gynecology

## 2016-07-30 ENCOUNTER — Encounter (HOSPITAL_COMMUNITY): Payer: Self-pay

## 2016-07-30 ENCOUNTER — Ambulatory Visit (HOSPITAL_COMMUNITY)
Admission: RE | Admit: 2016-07-30 | Discharge: 2016-07-30 | Disposition: A | Discharge status: Home / Self Care | Payer: BLUE CROSS/BLUE SHIELD | Source: Ambulatory Visit | Attending: Certified Nurse Midwife | Admitting: Certified Nurse Midwife

## 2016-07-30 DIAGNOSIS — O09892 Supervision of other high risk pregnancies, second trimester: Secondary | ICD-10-CM | POA: Insufficient documentation

## 2016-07-30 DIAGNOSIS — Z362 Encounter for other antenatal screening follow-up: Secondary | ICD-10-CM | POA: Diagnosis present

## 2016-07-30 DIAGNOSIS — O30039 Twin pregnancy, monochorionic/diamniotic, unspecified trimester: Secondary | ICD-10-CM

## 2016-07-30 DIAGNOSIS — Z3A26 26 weeks gestation of pregnancy: Secondary | ICD-10-CM | POA: Diagnosis not present

## 2016-07-30 DIAGNOSIS — O99212 Obesity complicating pregnancy, second trimester: Secondary | ICD-10-CM | POA: Insufficient documentation

## 2016-07-30 DIAGNOSIS — O30019 Twin pregnancy, monochorionic/monoamniotic, unspecified trimester: Secondary | ICD-10-CM

## 2016-07-30 DIAGNOSIS — O360190 Maternal care for anti-D [Rh] antibodies, unspecified trimester, not applicable or unspecified: Secondary | ICD-10-CM | POA: Insufficient documentation

## 2016-07-30 DIAGNOSIS — O30032 Twin pregnancy, monochorionic/diamniotic, second trimester: Secondary | ICD-10-CM | POA: Insufficient documentation

## 2016-08-04 ENCOUNTER — Other Ambulatory Visit: Payer: BLUE CROSS/BLUE SHIELD

## 2016-08-04 ENCOUNTER — Encounter: Payer: BLUE CROSS/BLUE SHIELD | Admitting: Obstetrics and Gynecology

## 2016-08-04 ENCOUNTER — Ambulatory Visit (INDEPENDENT_AMBULATORY_CARE_PROVIDER_SITE_OTHER): Payer: BLUE CROSS/BLUE SHIELD | Admitting: Obstetrics and Gynecology

## 2016-08-04 VITALS — BP 94/59 | HR 88 | Wt 232.0 lb

## 2016-08-04 DIAGNOSIS — O30032 Twin pregnancy, monochorionic/diamniotic, second trimester: Secondary | ICD-10-CM

## 2016-08-04 DIAGNOSIS — Z6791 Unspecified blood type, Rh negative: Secondary | ICD-10-CM

## 2016-08-04 DIAGNOSIS — O26899 Other specified pregnancy related conditions, unspecified trimester: Secondary | ICD-10-CM

## 2016-08-04 DIAGNOSIS — O09892 Supervision of other high risk pregnancies, second trimester: Secondary | ICD-10-CM

## 2016-08-04 DIAGNOSIS — O0992 Supervision of high risk pregnancy, unspecified, second trimester: Secondary | ICD-10-CM

## 2016-08-04 NOTE — Progress Notes (Signed)
Subjective:  Danielle Cisneros is a 26 y.o. G2P0010 at [redacted]w[redacted]d being seen today for ongoing prenatal care.  She is currently monitored for the following issues for this high-risk pregnancy and has Morbid obesity ([place]); Supervision of high-risk pregnancy; Rh negative state in antepartum period; Uterine fibroid complicating antenatal care, baby not yet delivered in second trimester; and Monochorionic diamniotic twin gestation on her problem list.  Patient reports some nasal congestion.  Contractions: Not present. Vag. Bleeding: None.  Movement: Present. Denies leaking of fluid.   The following portions of the patient's history were reviewed and updated as appropriate: allergies, current medications, past family history, past medical history, past social history, past surgical history and problem list. Problem list updated.  Objective:   Vitals:   08/04/16 0905  BP: (!) 94/59  Pulse: 88  Weight: 232 lb (105.2 kg)    Fetal Status: Fetal Heart Rate (bpm): A-134/ B-131   Movement: Present     General:  Alert, oriented and cooperative. Patient is in no acute distress.  Skin: Skin is warm and dry. No rash noted.   Cardiovascular: Normal heart rate noted  Respiratory: Normal respiratory effort, no problems with respiration noted  Abdomen: Soft, gravid, appropriate for gestational age. Pain/Pressure: Present     Pelvic:  Cervical exam deferred        Extremities: Normal range of motion.  Edema: None  Mental Status: Normal mood and affect. Normal behavior. Normal judgment and thought content.   Urinalysis:      Assessment and Plan:  Pregnancy: G2P0010 at [redacted]w[redacted]d  1. Monochorionic diamniotic twin gestation in second trimester Normal growth and no evidence of TTS on last scan F/U scan 2 weeks from last scan to eval for TTTS - [place] MFM OB LIMITED; Future  2. Supervision of high risk pregnancy in second trimester  - Glucose Tolerance, 2 Hours w/1 Hour - CBC - HIV antibody (with reflex) - RPR  3.  Rh negative state in antepartum period Rhogam at next OB visit  Preterm labor symptoms and general obstetric precautions including but not limited to vaginal bleeding, contractions, leaking of fluid and fetal movement were reviewed in detail with the patient. Please refer to After Visit Summary for other counseling recommendations.  No Follow-up on file.   Chancy Milroy, MD

## 2016-08-04 NOTE — Addendum Note (Signed)
Addended by: Carmela Hurt on: 08/04/2016 10:08 AM   Modules accepted: Orders

## 2016-08-04 NOTE — Addendum Note (Signed)
Addended by: Valli Glance F on: 08/04/2016 10:02 AM   Modules accepted: Orders

## 2016-08-05 ENCOUNTER — Encounter: Payer: Self-pay | Admitting: *Deleted

## 2016-08-05 LAB — CBC
HEMOGLOBIN: 9.8 g/dL — AB (ref 11.1–15.9)
Hematocrit: 30.8 % — ABNORMAL LOW (ref 34.0–46.6)
MCH: 25 pg — AB (ref 26.6–33.0)
MCHC: 31.8 g/dL (ref 31.5–35.7)
MCV: 79 fL (ref 79–97)
Platelets: 275 10*3/uL (ref 150–379)
RBC: 3.92 x10E6/uL (ref 3.77–5.28)
RDW: 15.3 % (ref 12.3–15.4)
WBC: 11.5 10*3/uL — ABNORMAL HIGH (ref 3.4–10.8)

## 2016-08-05 LAB — ANTIBODY SCREEN: Antibody Screen: NEGATIVE

## 2016-08-05 LAB — GLUCOSE TOLERANCE, 2 HOURS W/ 1HR
GLUCOSE, 1 HOUR: 116 mg/dL (ref 65–179)
Glucose, 2 hour: 94 mg/dL (ref 65–152)
Glucose, Fasting: 79 mg/dL (ref 65–91)

## 2016-08-05 LAB — RPR: RPR Ser Ql: NONREACTIVE

## 2016-08-05 LAB — HIV ANTIBODY (ROUTINE TESTING W REFLEX): HIV SCREEN 4TH GENERATION: NONREACTIVE

## 2016-08-06 ENCOUNTER — Other Ambulatory Visit: Payer: Self-pay

## 2016-08-06 MED ORDER — FERRALET 90 90-1 MG PO TABS: 1.0000 | ORAL_TABLET | Freq: Every day | ORAL | 11 refills | Status: DC

## 2016-08-11 ENCOUNTER — Ambulatory Visit (HOSPITAL_COMMUNITY)
Admission: RE | Admit: 2016-08-11 | Discharge: 2016-08-11 | Disposition: A | Discharge status: Home / Self Care | Payer: BLUE CROSS/BLUE SHIELD | Source: Ambulatory Visit | Attending: Certified Nurse Midwife | Admitting: Certified Nurse Midwife

## 2016-08-11 ENCOUNTER — Encounter (HOSPITAL_COMMUNITY): Payer: Self-pay

## 2016-08-11 DIAGNOSIS — Z6841 Body Mass Index (BMI) 40.0 and over, adult: Secondary | ICD-10-CM | POA: Insufficient documentation

## 2016-08-11 DIAGNOSIS — O99212 Obesity complicating pregnancy, second trimester: Secondary | ICD-10-CM | POA: Diagnosis not present

## 2016-08-11 DIAGNOSIS — O360190 Maternal care for anti-D [Rh] antibodies, unspecified trimester, not applicable or unspecified: Secondary | ICD-10-CM | POA: Insufficient documentation

## 2016-08-11 DIAGNOSIS — D303 Benign neoplasm of bladder: Secondary | ICD-10-CM | POA: Insufficient documentation

## 2016-08-11 DIAGNOSIS — Z3A28 28 weeks gestation of pregnancy: Secondary | ICD-10-CM | POA: Insufficient documentation

## 2016-08-11 DIAGNOSIS — O30032 Twin pregnancy, monochorionic/diamniotic, second trimester: Secondary | ICD-10-CM | POA: Insufficient documentation

## 2016-08-12 ENCOUNTER — Other Ambulatory Visit (HOSPITAL_COMMUNITY): Payer: Self-pay | Admitting: *Deleted

## 2016-08-12 DIAGNOSIS — O30033 Twin pregnancy, monochorionic/diamniotic, third trimester: Secondary | ICD-10-CM

## 2016-08-16 ENCOUNTER — Encounter: Payer: Self-pay | Admitting: Certified Nurse Midwife

## 2016-08-16 ENCOUNTER — Ambulatory Visit (INDEPENDENT_AMBULATORY_CARE_PROVIDER_SITE_OTHER): Payer: BLUE CROSS/BLUE SHIELD | Admitting: Certified Nurse Midwife

## 2016-08-16 ENCOUNTER — Ambulatory Visit (HOSPITAL_COMMUNITY)
Admission: RE | Admit: 2016-08-16 | Discharge: 2016-08-16 | Disposition: A | Discharge status: Home / Self Care | Payer: BLUE CROSS/BLUE SHIELD | Source: Ambulatory Visit | Attending: Certified Nurse Midwife | Admitting: Certified Nurse Midwife

## 2016-08-16 ENCOUNTER — Other Ambulatory Visit: Payer: Self-pay | Admitting: Certified Nurse Midwife

## 2016-08-16 ENCOUNTER — Encounter (HOSPITAL_COMMUNITY): Payer: Self-pay

## 2016-08-16 VITALS — BP 97/63 | HR 81 | Wt 234.0 lb

## 2016-08-16 DIAGNOSIS — E669 Obesity, unspecified: Secondary | ICD-10-CM | POA: Insufficient documentation

## 2016-08-16 DIAGNOSIS — O0993 Supervision of high risk pregnancy, unspecified, third trimester: Secondary | ICD-10-CM | POA: Insufficient documentation

## 2016-08-16 DIAGNOSIS — Z3A28 28 weeks gestation of pregnancy: Secondary | ICD-10-CM | POA: Diagnosis not present

## 2016-08-16 DIAGNOSIS — O26899 Other specified pregnancy related conditions, unspecified trimester: Secondary | ICD-10-CM

## 2016-08-16 DIAGNOSIS — O30032 Twin pregnancy, monochorionic/diamniotic, second trimester: Secondary | ICD-10-CM

## 2016-08-16 DIAGNOSIS — O360920 Maternal care for other rhesus isoimmunization, second trimester, not applicable or unspecified: Secondary | ICD-10-CM | POA: Diagnosis not present

## 2016-08-16 DIAGNOSIS — O0992 Supervision of high risk pregnancy, unspecified, second trimester: Secondary | ICD-10-CM

## 2016-08-16 DIAGNOSIS — O368390 Maternal care for abnormalities of the fetal heart rate or rhythm, unspecified trimester, not applicable or unspecified: Secondary | ICD-10-CM

## 2016-08-16 DIAGNOSIS — O30033 Twin pregnancy, monochorionic/diamniotic, third trimester: Secondary | ICD-10-CM | POA: Diagnosis not present

## 2016-08-16 DIAGNOSIS — Z23 Encounter for immunization: Secondary | ICD-10-CM | POA: Diagnosis not present

## 2016-08-16 DIAGNOSIS — Z6791 Unspecified blood type, Rh negative: Secondary | ICD-10-CM

## 2016-08-16 DIAGNOSIS — O368330 Maternal care for abnormalities of the fetal heart rate or rhythm, third trimester, not applicable or unspecified: Secondary | ICD-10-CM | POA: Diagnosis not present

## 2016-08-16 DIAGNOSIS — O09892 Supervision of other high risk pregnancies, second trimester: Secondary | ICD-10-CM

## 2016-08-16 DIAGNOSIS — O99213 Obesity complicating pregnancy, third trimester: Secondary | ICD-10-CM | POA: Diagnosis not present

## 2016-08-16 DIAGNOSIS — O99212 Obesity complicating pregnancy, second trimester: Secondary | ICD-10-CM

## 2016-08-16 MED ORDER — RHO D IMMUNE GLOBULIN 1500 UNIT/2ML IJ SOSY
300.0000 ug | PREFILLED_SYRINGE | Freq: Once | INTRAMUSCULAR | Status: AC
  Administered 2016-08-16: 300 ug via INTRAMUSCULAR

## 2016-08-16 NOTE — Progress Notes (Signed)
   PRENATAL VISIT NOTE  Subjective:  Danielle Cisneros is a 26 y.o. G2P0010 at [redacted]w[redacted]d being seen today for ongoing prenatal care.  She is currently monitored for the following issues for this high-risk pregnancy and has Morbid obesity ([place]); Supervision of high-risk pregnancy; Rh negative state in antepartum period; Uterine fibroid complicating antenatal care, baby not yet delivered in second trimester; and Monochorionic diamniotic twin gestation on her problem list.  Patient reports no complaints.  Contractions: Not present. Vag. Bleeding: None.  Movement: Present. Denies leaking of fluid.   The following portions of the patient's history were reviewed and updated as appropriate: allergies, current medications, past family history, past medical history, past social history, past surgical history and problem list. Problem list updated.  Objective:   Vitals:   08/16/16 0906  BP: 97/63  Pulse: 81  Weight: 234 lb (106.1 kg)    Fetal Status: Fetal Heart Rate (bpm): A: 136/ B: 141; irregular rthym noted Fundal Height: 36 cm Movement: Present     General:  Alert, oriented and cooperative. Patient is in no acute distress.  Skin: Skin is warm and dry. No rash noted.   Cardiovascular: Normal heart rate noted  Respiratory: Normal respiratory effort, no problems with respiration noted  Abdomen: Soft, gravid, appropriate for gestational age. Pain/Pressure: Absent     Pelvic:  Cervical exam deferred        Extremities: Normal range of motion.  Edema: None  Mental Status: Normal mood and affect. Normal behavior. Normal judgment and thought content.   Assessment and Plan:  Pregnancy: G2P0010 at [redacted]w[redacted]d  1. Supervision of high risk pregnancy in second trimester     Start antenatal testing @32  weeks.  BPP today per Dr. Elly Modena for B's irregular heart beat.       TDaP given today.  2. Rh negative state in antepartum period     Rhogam given today 08/16/16  3. Morbid obesity ([place])     Has lost 6 lbs.     4. Monochorionic diamniotic twin gestation in second trimester     No evidence of TTTS on 08/11/16. Limited ultrasound to screen for TTTS in 2 weeks: scheduled      Ultrasound for serially for growth  Preterm labor symptoms and general obstetric precautions including but not limited to vaginal bleeding, contractions, leaking of fluid and fetal movement were reviewed in detail with the patient. Please refer to After Visit Summary for other counseling recommendations.  Return in about 2 weeks (around 08/30/2016) for [facility], Needs to see FP MD here; start antenatal testing @32  weeks mono/di twins.   Morene Crocker, CNM

## 2016-08-16 NOTE — Progress Notes (Signed)
Patient reports good fetal movement, denies pain. 

## 2016-08-16 NOTE — Patient Instructions (Addendum)
AREA PEDIATRIC/FAMILY PRACTICE PHYSICIANS  [facility] [address], [address] [place], [place]  [address] Phone - [phone]   Fax - [phone]  [facility] [address] [place], [place] [address] Phone - [phone]   Fax - [phone]  [facility] AM[facility] [address] [place], [place]  [address] Phone - [phone]   Fax - [phone]  [facility] [address] [place], [place]  [address] Phone - [phone]   Fax - [phone]  [facility] [address] [place], [place]  [address] Phone - [phone]   Fax - [phone]  [facility] [address] [place], [place]  [address] Phone - [phone]   Fax - [phone]  [facility] [address] [place], [place]  [address] Phone - [phone]   Fax - [phone]  [facility] [address], [address] [place], [place]  [address] Phone - [phone]   Fax - [phone]  [facility] [address] [place], [place]  [address] Phone - [phone]   Fax - [phone] [facility] [address] [place], [place]  [address] Phone - [phone]   Fax - [phone]  [facility] [address] [place], [place]  [address] Phone - [phone]   Fax - [phone]  [facility] [address] [place], [place]  [address] Phone - [phone]   Fax - [phone]  [facility] [address] [place], [place]  [address] Phone - [phone]   Fax - [phone]  SHILPA GOSRANI [address] [place], [place]  [address] Phone - [phone]  [facility] [address] [place], [place]  [address] Phone - [phone]   Fax - [phone]  [facility] [address] [place], [place]  [address] Phone - [phone]   Fax - [phone]  [facility] [address] [place], [place]  [address] Phone - [phone]   Fax - [phone]  [facility] [address] [place], [place]  [address] Phone - [phone]   Fax - [phone]   [facility] [address] [place], [place]  [address] Phone - [phone]   Fax - [phone]  [facility] [address] [place], [place]  [address] Phone - [phone]   Fax - [phone]  DAVID RUBIN [address] [place], [place]  [address] Phone - [phone]   Fax - [phone]  [facility] [address] [place], [place]  [address] Phone - [phone]   Fax - [phone]  [facility] [address] [place], [place]  [address] Phone - [phone]   Fax - [phone] [facility] [address] [place], [place]  [address] Phone - [phone]   Fax - [phone]  [facility] [address] [place], [place]  [address] Phone - [phone]   Fax - [phone]  [facility] [address] [place],   [address] Phone - [phone]   Fax - [phone]  [facility] Charlene Flemming MD [address] [place]  [address] Phone [phone]  Fax [phone]  Contraception Choices Contraception (birth control) is the use of any methods or devices to prevent   pregnancy. Below are some methods to help avoid pregnancy. Hormonal methods  Contraceptive implant. This is a thin, plastic tube containing progesterone hormone. It does not contain estrogen hormone. Your health care provider inserts the tube in the inner part of the upper arm. The tube can remain in place for up to 3 years. After 3 years, the implant must be removed. The implant prevents the  ovaries from releasing an egg (ovulation), thickens the cervical mucus to prevent sperm from entering the uterus, and thins the lining of the inside of the uterus.  Progesterone-only injections. These injections are given every 3 months by your health care provider to prevent pregnancy. This synthetic progesterone hormone stops the ovaries from releasing eggs. It also thickens cervical mucus and changes the uterine lining. This makes it harder for sperm to survive in the uterus.  Birth control pills. These pills contain estrogen and progesterone hormone. They work by preventing the ovaries from releasing eggs (ovulation). They also cause the cervical mucus to thicken, preventing the sperm from entering the uterus. Birth control pills are prescribed by a health care provider.Birth control pills can also be used to treat heavy periods.  Minipill. This type of birth control pill contains only the progesterone hormone. They are taken every day of each month and must be prescribed by your health care provider.  Birth control patch. The patch contains hormones similar to those in birth control pills. It must be changed once a week and is prescribed by a health care provider.  Vaginal ring. The ring contains hormones similar to those in birth control pills. It is left in the vagina for 3 weeks, removed for 1 week, and then a new one is put back in place. The patient must be comfortable inserting and removing the ring from the vagina.A health care provider's prescription is necessary.  Emergency contraception. Emergency contraceptives prevent pregnancy after unprotected sexual intercourse. This pill can be taken right after sex or up to 5 days after unprotected sex. It is most effective the sooner you take the pills after having sexual intercourse. Most emergency contraceptive pills are available without a prescription. Check with your pharmacist. Do not use emergency contraception as your only form of birth  control. Barrier methods  Female condom. This is a thin sheath (latex or rubber) that is worn over the penis during sexual intercourse. It can be used with spermicide to increase effectiveness.  Female condom. This is a soft, loose-fitting sheath that is put into the vagina before sexual intercourse.  Diaphragm. This is a soft, latex, dome-shaped barrier that must be fitted by a health care provider. It is inserted into the vagina, along with a spermicidal jelly. It is inserted before intercourse. The diaphragm should be left in the vagina for 6 to 8 hours after intercourse.  Cervical cap. This is a round, soft, latex or plastic cup that fits over the cervix and must be fitted by a health care provider. The cap can be left in place for up to 48 hours after intercourse.  Sponge. This is a soft, circular piece of polyurethane foam. The sponge has spermicide in it. It is inserted into the vagina after wetting it and before sexual intercourse.  Spermicides. These are chemicals that kill or block sperm from entering the cervix and uterus. They come in the form of creams, jellies, suppositories, foam, or tablets. They do not require a prescription. They are inserted into the vagina with an applicator before having sexual intercourse. The process   must be repeated every time you have sexual intercourse. Intrauterine contraception  Intrauterine device (IUD). This is a T-shaped device that is put in a woman's uterus during a menstrual period to prevent pregnancy. There are 2 types: ? Copper IUD. This type of IUD is wrapped in copper wire and is placed inside the uterus. Copper makes the uterus and fallopian tubes produce a fluid that kills sperm. It can stay in place for 10 years. ? Hormone IUD. This type of IUD contains the hormone progestin (synthetic progesterone). The hormone thickens the cervical mucus and prevents sperm from entering the uterus, and it also thins the uterine lining to prevent  implantation of a fertilized egg. The hormone can weaken or kill the sperm that get into the uterus. It can stay in place for 3-5 years, depending on which type of IUD is used. Permanent methods of contraception  Female tubal ligation. This is when the woman's fallopian tubes are surgically sealed, tied, or blocked to prevent the egg from traveling to the uterus.  Hysteroscopic sterilization. This involves placing a small coil or insert into each fallopian tube. Your doctor uses a technique called hysteroscopy to do the procedure. The device causes scar tissue to form. This results in permanent blockage of the fallopian tubes, so the sperm cannot fertilize the egg. It takes about 3 months after the procedure for the tubes to become blocked. You must use another form of birth control for these 3 months.  Female sterilization. This is when the female has the tubes that carry sperm tied off (vasectomy).This blocks sperm from entering the vagina during sexual intercourse. After the procedure, the man can still ejaculate fluid (semen). Natural planning methods  Natural family planning. This is not having sexual intercourse or using a barrier method (condom, diaphragm, cervical cap) on days the woman could become pregnant.  Calendar method. This is keeping track of the length of each menstrual cycle and identifying when you are fertile.  Ovulation method. This is avoiding sexual intercourse during ovulation.  Symptothermal method. This is avoiding sexual intercourse during ovulation, using a thermometer and ovulation symptoms.  Post-ovulation method. This is timing sexual intercourse after you have ovulated. Regardless of which type or method of contraception you choose, it is important that you use condoms to protect against the transmission of sexually transmitted infections (STIs). Talk with your health care provider about which form of contraception is most appropriate for you. This information is not  intended to replace advice given to you by your health care provider. Make sure you discuss any questions you have with your health care provider. Document Released: 03/22/2005 Document Revised: 08/28/2015 Document Reviewed: 09/14/2012 Elsevier Interactive Patient Education  2017 [employer]vier Inc.  

## 2016-08-17 ENCOUNTER — Other Ambulatory Visit: Payer: Self-pay | Admitting: Certified Nurse Midwife

## 2016-08-25 ENCOUNTER — Ambulatory Visit (HOSPITAL_COMMUNITY)
Admission: RE | Admit: 2016-08-25 | Discharge: 2016-08-25 | Disposition: A | Discharge status: Home / Self Care | Payer: BLUE CROSS/BLUE SHIELD | Source: Ambulatory Visit | Attending: Certified Nurse Midwife | Admitting: Certified Nurse Midwife

## 2016-08-25 ENCOUNTER — Encounter (HOSPITAL_COMMUNITY): Payer: Self-pay

## 2016-08-25 ENCOUNTER — Other Ambulatory Visit (HOSPITAL_COMMUNITY): Payer: Self-pay | Admitting: Obstetrics and Gynecology

## 2016-08-25 DIAGNOSIS — O30033 Twin pregnancy, monochorionic/diamniotic, third trimester: Secondary | ICD-10-CM | POA: Diagnosis present

## 2016-08-25 DIAGNOSIS — O99213 Obesity complicating pregnancy, third trimester: Secondary | ICD-10-CM

## 2016-08-25 DIAGNOSIS — O3413 Maternal care for benign tumor of corpus uteri, third trimester: Secondary | ICD-10-CM | POA: Diagnosis present

## 2016-08-25 DIAGNOSIS — Z3A30 30 weeks gestation of pregnancy: Secondary | ICD-10-CM | POA: Diagnosis not present

## 2016-08-25 DIAGNOSIS — D259 Leiomyoma of uterus, unspecified: Secondary | ICD-10-CM

## 2016-08-26 ENCOUNTER — Other Ambulatory Visit (HOSPITAL_COMMUNITY): Payer: Self-pay | Admitting: *Deleted

## 2016-08-26 DIAGNOSIS — O30033 Twin pregnancy, monochorionic/diamniotic, third trimester: Secondary | ICD-10-CM

## 2016-09-01 ENCOUNTER — Encounter: Payer: BLUE CROSS/BLUE SHIELD | Admitting: Obstetrics & Gynecology

## 2016-09-02 ENCOUNTER — Ambulatory Visit (INDEPENDENT_AMBULATORY_CARE_PROVIDER_SITE_OTHER): Payer: BLUE CROSS/BLUE SHIELD | Admitting: Obstetrics and Gynecology

## 2016-09-02 VITALS — BP 113/68 | HR 93 | Wt 240.6 lb

## 2016-09-02 DIAGNOSIS — O30032 Twin pregnancy, monochorionic/diamniotic, second trimester: Secondary | ICD-10-CM

## 2016-09-02 DIAGNOSIS — Z6791 Unspecified blood type, Rh negative: Secondary | ICD-10-CM

## 2016-09-02 DIAGNOSIS — O26899 Other specified pregnancy related conditions, unspecified trimester: Secondary | ICD-10-CM

## 2016-09-02 DIAGNOSIS — O30033 Twin pregnancy, monochorionic/diamniotic, third trimester: Secondary | ICD-10-CM

## 2016-09-02 DIAGNOSIS — O0993 Supervision of high risk pregnancy, unspecified, third trimester: Secondary | ICD-10-CM | POA: Diagnosis not present

## 2016-09-02 NOTE — Addendum Note (Signed)
Addended by: Mora Bellman on: 09/02/2016 03:26 PM   Modules accepted: Orders

## 2016-09-02 NOTE — Progress Notes (Signed)
   PRENATAL VISIT NOTE  Subjective:  Danielle Cisneros is a 26 y.o. G2P0010 at [redacted]w[redacted]d being seen today for ongoing prenatal care.  She is currently monitored for the following issues for this high-risk pregnancy and has Morbid obesity ([place]); Supervision of high-risk pregnancy; Rh negative state in antepartum period; Uterine fibroid complicating antenatal care, baby not yet delivered in second trimester; and Monochorionic diamniotic twin gestation on her problem list.  Patient reports no complaints.  Contractions: Not present. Vag. Bleeding: None.  Movement: Present. Denies leaking of fluid.   The following portions of the patient's history were reviewed and updated as appropriate: allergies, current medications, past family history, past medical history, past social history, past surgical history and problem list. Problem list updated.  Objective:   Vitals:   09/02/16 1416  BP: 113/68  Pulse: 93  Weight: 240 lb 9.6 oz (109.1 kg)    Fetal Status: Fetal Heart Rate (bpm): 135/130   Movement: Present     General:  Alert, oriented and cooperative. Patient is in no acute distress.  Skin: Skin is warm and dry. No rash noted.   Cardiovascular: Normal heart rate noted  Respiratory: Normal respiratory effort, no problems with respiration noted  Abdomen: Soft, gravid, appropriate for gestational age. Pain/Pressure: Present     Pelvic:  Cervical exam deferred        Extremities: Normal range of motion.  Edema: None  Mental Status: Normal mood and affect. Normal behavior. Normal judgment and thought content.   Assessment and Plan:  Pregnancy: G2P0010 at 101w1d  1. Supervision of high risk pregnancy in third trimester Patient is doing well without complaints   2. Monochorionic diamniotic twin gestation in third trimester No evidence of twin-twin transfusion syndrome on 5/23 ultrasound Follow-up growth scheduled Will start fetal testing with weekly NST and BPP - Fetal nonstress test; Future - [place]  MFM FETAL BPP WO NON STRESS; Future  3. Rh negative state in antepartum period S/p rhogam  Preterm labor symptoms and general obstetric precautions including but not limited to vaginal bleeding, contractions, leaking of fluid and fetal movement were reviewed in detail with the patient. Please refer to After Visit Summary for other counseling recommendations.  Return in about 1 week (around 09/09/2016) for ROB, NST.   Mora Bellman, MD

## 2016-09-08 ENCOUNTER — Other Ambulatory Visit (HOSPITAL_COMMUNITY): Payer: Self-pay | Admitting: Obstetrics and Gynecology

## 2016-09-08 ENCOUNTER — Encounter (HOSPITAL_COMMUNITY): Payer: Self-pay

## 2016-09-08 ENCOUNTER — Other Ambulatory Visit: Payer: Self-pay | Admitting: Obstetrics and Gynecology

## 2016-09-08 ENCOUNTER — Ambulatory Visit (HOSPITAL_COMMUNITY)
Admission: RE | Admit: 2016-09-08 | Discharge: 2016-09-08 | Disposition: A | Discharge status: Home / Self Care | Payer: BLUE CROSS/BLUE SHIELD | Source: Ambulatory Visit | Attending: Certified Nurse Midwife | Admitting: Certified Nurse Midwife

## 2016-09-08 DIAGNOSIS — O3413 Maternal care for benign tumor of corpus uteri, third trimester: Secondary | ICD-10-CM

## 2016-09-08 DIAGNOSIS — O30033 Twin pregnancy, monochorionic/diamniotic, third trimester: Secondary | ICD-10-CM

## 2016-09-08 DIAGNOSIS — E669 Obesity, unspecified: Secondary | ICD-10-CM | POA: Insufficient documentation

## 2016-09-08 DIAGNOSIS — D259 Leiomyoma of uterus, unspecified: Secondary | ICD-10-CM

## 2016-09-08 DIAGNOSIS — Z6841 Body Mass Index (BMI) 40.0 and over, adult: Secondary | ICD-10-CM | POA: Diagnosis not present

## 2016-09-08 DIAGNOSIS — Z3A32 32 weeks gestation of pregnancy: Secondary | ICD-10-CM

## 2016-09-08 DIAGNOSIS — O99213 Obesity complicating pregnancy, third trimester: Secondary | ICD-10-CM

## 2016-09-08 NOTE — Addendum Note (Signed)
Encounter addended by: Abigail Butts on: 09/08/2016  4:07 PM<BR>    Actions taken: Imaging Exam ended

## 2016-09-09 ENCOUNTER — Ambulatory Visit (INDEPENDENT_AMBULATORY_CARE_PROVIDER_SITE_OTHER): Payer: BLUE CROSS/BLUE SHIELD | Admitting: Obstetrics and Gynecology

## 2016-09-09 VITALS — BP 104/68 | HR 94

## 2016-09-09 DIAGNOSIS — O26899 Other specified pregnancy related conditions, unspecified trimester: Secondary | ICD-10-CM

## 2016-09-09 DIAGNOSIS — O30033 Twin pregnancy, monochorionic/diamniotic, third trimester: Secondary | ICD-10-CM | POA: Diagnosis not present

## 2016-09-09 DIAGNOSIS — Z6791 Unspecified blood type, Rh negative: Secondary | ICD-10-CM

## 2016-09-09 DIAGNOSIS — O0993 Supervision of high risk pregnancy, unspecified, third trimester: Secondary | ICD-10-CM

## 2016-09-09 NOTE — Progress Notes (Signed)
   PRENATAL VISIT NOTE  Subjective:  Danielle Cisneros is a 26 y.o. G2P0010 at [redacted]w[redacted]d being seen today for ongoing prenatal care.  She is currently monitored for the following issues for this high-risk pregnancy and has Morbid obesity ([place]); Supervision of high-risk pregnancy; Rh negative state in antepartum period; Uterine fibroid complicating antenatal care, baby not yet delivered in second trimester; and Monochorionic diamniotic twin gestation on her problem list.  Patient reports backache.  Contractions: Not present. Vag. Bleeding: None.  Movement: Present. Denies leaking of fluid.   The following portions of the patient's history were reviewed and updated as appropriate: allergies, current medications, past family history, past medical history, past social history, past surgical history and problem list. Problem list updated.  Objective:   Vitals:   09/09/16 1044  BP: 104/68  Pulse: 94    Fetal Status: Fetal Heart Rate (bpm): NST   Movement: Present     General:  Alert, oriented and cooperative. Patient is in no acute distress.  Skin: Skin is warm and dry. No rash noted.   Cardiovascular: Normal heart rate noted  Respiratory: Normal respiratory effort, no problems with respiration noted  Abdomen: Soft, gravid, appropriate for gestational age. Pain/Pressure: Present     Pelvic:  Cervical exam deferred        Extremities: Normal range of motion.     Mental Status: Normal mood and affect. Normal behavior. Normal judgment and thought content.   Assessment and Plan:  Pregnancy: G2P0010 at [redacted]w[redacted]d  1. Supervision of high risk pregnancy in third trimester Patient is doing well   2. Monochorionic diamniotic twin gestation in third trimester BPP 8/8 x 2 with normal fluids x 2 on 6/6 Verte/breech NST reviewed and  Reactive x 2 with baseline 140/135, mod variability x 2, +accels x 2, no decels x 2  3. Rh negative state in antepartum period S/p rhogam  Preterm labor symptoms and general  obstetric precautions including but not limited to vaginal bleeding, contractions, leaking of fluid and fetal movement were reviewed in detail with the patient. Please refer to After Visit Summary for other counseling recommendations.  Return in about 1 week (around 09/16/2016) for ROB, NST.   Mora Bellman, MD

## 2016-09-09 NOTE — Progress Notes (Signed)
Pt complaints of some back pain.

## 2016-09-14 ENCOUNTER — Ambulatory Visit (HOSPITAL_COMMUNITY)
Admission: RE | Admit: 2016-09-14 | Discharge: 2016-09-14 | Disposition: A | Discharge status: Home / Self Care | Payer: BLUE CROSS/BLUE SHIELD | Source: Ambulatory Visit | Attending: Obstetrics and Gynecology | Admitting: Obstetrics and Gynecology

## 2016-09-14 ENCOUNTER — Other Ambulatory Visit: Payer: BLUE CROSS/BLUE SHIELD

## 2016-09-14 DIAGNOSIS — Z3A32 32 weeks gestation of pregnancy: Secondary | ICD-10-CM | POA: Diagnosis not present

## 2016-09-14 DIAGNOSIS — O321XX0 Maternal care for breech presentation, not applicable or unspecified: Secondary | ICD-10-CM | POA: Diagnosis not present

## 2016-09-14 DIAGNOSIS — O0993 Supervision of high risk pregnancy, unspecified, third trimester: Secondary | ICD-10-CM | POA: Diagnosis present

## 2016-09-14 DIAGNOSIS — O30033 Twin pregnancy, monochorionic/diamniotic, third trimester: Secondary | ICD-10-CM | POA: Diagnosis present

## 2016-09-16 ENCOUNTER — Ambulatory Visit (HOSPITAL_COMMUNITY)
Admission: RE | Admit: 2016-09-16 | Discharge: 2016-09-16 | Disposition: A | Discharge status: Home / Self Care | Payer: BLUE CROSS/BLUE SHIELD | Source: Ambulatory Visit | Attending: Obstetrics and Gynecology | Admitting: Obstetrics and Gynecology

## 2016-09-16 ENCOUNTER — Ambulatory Visit (INDEPENDENT_AMBULATORY_CARE_PROVIDER_SITE_OTHER): Payer: BLUE CROSS/BLUE SHIELD | Admitting: Obstetrics and Gynecology

## 2016-09-16 ENCOUNTER — Other Ambulatory Visit: Payer: BLUE CROSS/BLUE SHIELD

## 2016-09-16 VITALS — BP 119/75 | HR 91 | Wt 240.0 lb

## 2016-09-16 DIAGNOSIS — Z3A33 33 weeks gestation of pregnancy: Secondary | ICD-10-CM | POA: Diagnosis not present

## 2016-09-16 DIAGNOSIS — O26899 Other specified pregnancy related conditions, unspecified trimester: Secondary | ICD-10-CM

## 2016-09-16 DIAGNOSIS — O0993 Supervision of high risk pregnancy, unspecified, third trimester: Secondary | ICD-10-CM | POA: Diagnosis present

## 2016-09-16 DIAGNOSIS — O289 Unspecified abnormal findings on antenatal screening of mother: Secondary | ICD-10-CM | POA: Diagnosis not present

## 2016-09-16 DIAGNOSIS — Z6791 Unspecified blood type, Rh negative: Secondary | ICD-10-CM

## 2016-09-16 DIAGNOSIS — O30033 Twin pregnancy, monochorionic/diamniotic, third trimester: Secondary | ICD-10-CM

## 2016-09-16 MED ORDER — CYCLOBENZAPRINE HCL 10 MG PO TABS: 10.0000 mg | ORAL_TABLET | Freq: Three times a day (TID) | ORAL | 1 refills | Status: DC | PRN

## 2016-09-16 NOTE — Addendum Note (Signed)
Addended by: Mora Bellman on: 09/16/2016 10:34 AM   Modules accepted: Orders

## 2016-09-16 NOTE — Progress Notes (Addendum)
   PRENATAL VISIT NOTE  Subjective:  Danielle Cisneros is a 26 y.o. G2P0010 at [redacted]w[redacted]d being seen today for ongoing prenatal care.  She is currently monitored for the following issues for this high-risk pregnancy and has Morbid obesity ([place]); Supervision of high-risk pregnancy; Rh negative state in antepartum period; Uterine fibroid complicating antenatal care, baby not yet delivered in second trimester; and Monochorionic diamniotic twin gestation on her problem list.  Patient reports backache.  Contractions: Not present. Vag. Bleeding: None.  Movement: Present. Denies leaking of fluid.   The following portions of the patient's history were reviewed and updated as appropriate: allergies, current medications, past family history, past medical history, past social history, past surgical history and problem list. Problem list updated.  Objective:   Vitals:   09/16/16 1000  BP: 119/75  Pulse: 91  Weight: 240 lb (108.9 kg)    Fetal Status: Fetal Heart Rate (bpm): NST   Movement: Present     General:  Alert, oriented and cooperative. Patient is in no acute distress.  Skin: Skin is warm and dry. No rash noted.   Cardiovascular: Normal heart rate noted  Respiratory: Normal respiratory effort, no problems with respiration noted  Abdomen: Soft, gravid, appropriate for gestational age. Pain/Pressure: Present     Pelvic:  Cervical exam deferred        Extremities: Normal range of motion.     Mental Status: Normal mood and affect. Normal behavior. Normal judgment and thought content.   Assessment and Plan:  Pregnancy: G2P0010 at [redacted]w[redacted]d  1. Monochorionic diamniotic twin gestation in third trimester BPP 8/8 x 2 on 6/12 with fetal presentation cephalic/breech  Follow up growth on 6/20 NST reviewed and non reactive with baseline 120/125, min-mod variability, no accels, occasional variable decels. Patient sent for STAT BPP  2. Supervision of high risk pregnancy in third trimester Patient is doing  well Encouraged the use of pregnancy belt and stretching Offered Flexeril  3. Rh negative state in antepartum period S/p rhogam  Preterm labor symptoms and general obstetric precautions including but not limited to vaginal bleeding, contractions, leaking of fluid and fetal movement were reviewed in detail with the patient. Please refer to After Visit Summary for other counseling recommendations.  No Follow-up on file.   Mora Bellman, MD

## 2016-09-16 NOTE — Progress Notes (Signed)
Pt states back pain is getting worse. Pt has maternity support belt she uses occasional as it is uncomfortable.  Pt to have NST today.

## 2016-09-20 ENCOUNTER — Ambulatory Visit (HOSPITAL_COMMUNITY): Payer: BLUE CROSS/BLUE SHIELD

## 2016-09-22 ENCOUNTER — Other Ambulatory Visit (HOSPITAL_COMMUNITY): Payer: Self-pay | Admitting: Obstetrics and Gynecology

## 2016-09-22 ENCOUNTER — Encounter (HOSPITAL_COMMUNITY): Payer: Self-pay

## 2016-09-22 ENCOUNTER — Ambulatory Visit (HOSPITAL_COMMUNITY)
Admission: RE | Admit: 2016-09-22 | Discharge: 2016-09-22 | Disposition: A | Discharge status: Home / Self Care | Payer: BLUE CROSS/BLUE SHIELD | Source: Ambulatory Visit | Attending: Obstetrics and Gynecology | Admitting: Obstetrics and Gynecology

## 2016-09-22 DIAGNOSIS — O30033 Twin pregnancy, monochorionic/diamniotic, third trimester: Secondary | ICD-10-CM

## 2016-09-22 DIAGNOSIS — Z3A34 34 weeks gestation of pregnancy: Secondary | ICD-10-CM | POA: Diagnosis not present

## 2016-09-22 DIAGNOSIS — D259 Leiomyoma of uterus, unspecified: Secondary | ICD-10-CM | POA: Diagnosis not present

## 2016-09-22 DIAGNOSIS — O99213 Obesity complicating pregnancy, third trimester: Secondary | ICD-10-CM | POA: Insufficient documentation

## 2016-09-22 DIAGNOSIS — O3413 Maternal care for benign tumor of corpus uteri, third trimester: Secondary | ICD-10-CM | POA: Diagnosis not present

## 2016-09-26 ENCOUNTER — Encounter: Payer: Self-pay | Admitting: Obstetrics and Gynecology

## 2016-09-27 ENCOUNTER — Inpatient Hospital Stay (EMERGENCY_DEPARTMENT_HOSPITAL)
Admission: AD | Admit: 2016-09-27 | Discharge: 2016-09-27 | Disposition: A | Discharge status: Home / Self Care | Payer: BLUE CROSS/BLUE SHIELD | Source: Ambulatory Visit | Attending: Obstetrics and Gynecology | Admitting: Obstetrics and Gynecology

## 2016-09-27 ENCOUNTER — Encounter (HOSPITAL_COMMUNITY): Payer: Self-pay

## 2016-09-27 ENCOUNTER — Inpatient Hospital Stay (HOSPITAL_COMMUNITY): Payer: BLUE CROSS/BLUE SHIELD

## 2016-09-27 DIAGNOSIS — O99213 Obesity complicating pregnancy, third trimester: Secondary | ICD-10-CM | POA: Insufficient documentation

## 2016-09-27 DIAGNOSIS — M25551 Pain in right hip: Secondary | ICD-10-CM

## 2016-09-27 DIAGNOSIS — Z7982 Long term (current) use of aspirin: Secondary | ICD-10-CM | POA: Insufficient documentation

## 2016-09-27 DIAGNOSIS — R109 Unspecified abdominal pain: Secondary | ICD-10-CM

## 2016-09-27 DIAGNOSIS — O26899 Other specified pregnancy related conditions, unspecified trimester: Secondary | ICD-10-CM

## 2016-09-27 DIAGNOSIS — D259 Leiomyoma of uterus, unspecified: Secondary | ICD-10-CM

## 2016-09-27 DIAGNOSIS — R350 Frequency of micturition: Secondary | ICD-10-CM | POA: Insufficient documentation

## 2016-09-27 DIAGNOSIS — O26893 Other specified pregnancy related conditions, third trimester: Secondary | ICD-10-CM | POA: Insufficient documentation

## 2016-09-27 DIAGNOSIS — O3413 Maternal care for benign tumor of corpus uteri, third trimester: Secondary | ICD-10-CM

## 2016-09-27 DIAGNOSIS — Z3A34 34 weeks gestation of pregnancy: Secondary | ICD-10-CM | POA: Diagnosis not present

## 2016-09-27 DIAGNOSIS — Z79899 Other long term (current) drug therapy: Secondary | ICD-10-CM

## 2016-09-27 DIAGNOSIS — D219 Benign neoplasm of connective and other soft tissue, unspecified: Secondary | ICD-10-CM

## 2016-09-27 DIAGNOSIS — O42013 Preterm premature rupture of membranes, onset of labor within 24 hours of rupture, third trimester: Secondary | ICD-10-CM | POA: Diagnosis not present

## 2016-09-27 DIAGNOSIS — M25552 Pain in left hip: Secondary | ICD-10-CM

## 2016-09-27 DIAGNOSIS — O30033 Twin pregnancy, monochorionic/diamniotic, third trimester: Secondary | ICD-10-CM

## 2016-09-27 LAB — URINALYSIS, ROUTINE W REFLEX MICROSCOPIC
Bilirubin Urine: NEGATIVE
Glucose, UA: NEGATIVE mg/dL
Ketones, ur: 20 mg/dL — AB
Leukocytes, UA: NEGATIVE
Nitrite: NEGATIVE
PH: 5 (ref 5.0–8.0)
Protein, ur: NEGATIVE mg/dL
Specific Gravity, Urine: 1.01 (ref 1.005–1.030)

## 2016-09-27 MED ORDER — HYDROMORPHONE HCL 2 MG PO TABS: 2.0000 mg | ORAL_TABLET | Freq: Two times a day (BID) | ORAL | 0 refills | Status: DC | PRN

## 2016-09-27 MED ORDER — HYDROMORPHONE HCL 2 MG PO TABS
2.0000 mg | ORAL_TABLET | Freq: Once | ORAL | Status: AC
  Administered 2016-09-27: 2 mg via ORAL
  Filled 2016-09-27: qty 1

## 2016-09-27 NOTE — MAU Provider Note (Signed)
Chief Complaint:  Abdominal Pain and Hip Pain    HPI: Danielle Cisneros is a 26 y.o. G2P0010 at [redacted]w[redacted]d with Mono/Di twins (4% discordance on 6/20), who presents to maternity admissions reporting Abdominal pain and hip pain.  Patient states for the past 2 days she has been having bilateral lower pelvic/abdominal pain and bilateral hip pain. The pain is constant, not like contractions. The pain has progressively worsened over the past 2 days, especially to a specific area in right lower uterus/pelvic region, which is why she came in today. She is having frequency of urination, having urge to pee every 5-10 min, with minimal output. No dysuria. +Incomplete voiding. She states the pain is worse on the right compared to left, like a fullness in the right lower abdomen/uterus. She has not had a BM for 4 days. She had one episode of emesis yesterday after drinking tea. Denies diarrhea. Has little appetite. She states she feels occasional contractions as well but are not strong, are like [place] per patient. She uses a maternity belt while she walks, which helps with the pain, but only for a short time. [place]eneis CP/SOB, F/C, N/V.  Denies leakage of fluid or vaginal bleeding, but thought her mucous plug came out yesterday, but no abnormal discharge. Good fetal movement x2.   Pregnancy Course:   Past Medical History: Past Medical History:  Diagnosis Date  . Medical history non-contributory     Past obstetric history: OB History  Gravida Para Term Preterm AB Living  2 0 0   1 0  SAB TAB Ectopic Multiple Live Births  1       0    # Outcome Date GA Lbr Len/2nd Weight Sex Delivery Anes PTL Lv  2 Current           1 SAB             Obstetric Comments  SAB July 2017.     Past Surgical History: Past Surgical History:  Procedure Laterality Date  . NO PAST SURGERIES       Family History: No family history on file.  Social History: Social History  Substance Use Topics  . Smoking status: Never  Smoker  . Smokeless tobacco: Never Used  . Alcohol use No    Allergies: No Known Allergies  Meds:  Prescriptions Prior to Admission  Medication Sig Dispense Refill Last Dose  . aspirin EC 81 MG tablet Take 81 mg by mouth daily.   Past Week at Unknown time  . docusate sodium (COLACE) 100 MG capsule Take 100 mg by mouth 2 (two) times daily.   09/26/2016 at Unknown time  . Fe Cbn-Fe Gluc-FA-B12-C-DSS (FERRALET 90) 90-1 MG TABS Take 1 tablet by mouth daily. 30 each 11 09/26/2016 at Unknown time  . HYDROmorphone (DILAUDID) 2 MG tablet Take 1 tablet (2 mg total) by mouth every 12 (twelve) hours as needed for severe pain. 10 tablet 0   . prenatal vitamin w/FE, FA (NATACHEW) 29-1 MG CHEW chewable tablet Chew 1 tablet by mouth daily at 12 noon. 30 tablet 12 09/26/2016 at Unknown time    I have reviewed patient's Past Medical Hx, Surgical Hx, Family Hx, Social Hx, medications and allergies.   ROS:  A comprehensive ROS was negative except per HPI.    Physical Exam   Patient Vitals for the past 24 hrs:  BP Temp Temp src Pulse Resp SpO2 Height Weight  09/27/16 1427 (!) 114/92 - - 91 - - - -  09/27/16 1425 - 98.5 F (36.9 C) Oral - 18 - - -  09/27/16 1411 - - - - - 100 % - -  09/27/16 0929 124/65 98 F (36.7 C) - 97 18 - 5\' 3"  (1.6 m) 246 lb (111.6 kg)   Constitutional: Well-developed, well-nourished female in no acute distress.  Cardiovascular: normal rate, rhythm, no murmurs Respiratory: normal effort, CTAB GI: Abd soft, grossly tender over right lower quadrant of uterus, gravid appropriate for multi-gestational age. Pos BS x 4 MS: Extremities nontender, no edema, normal ROM Neurologic: Alert and oriented x 4.  GU: Neg CVAT. Pelvic: NEFG, physiologic discharge, no blood, cervix clean. No CMT BSUS: Baby A is vertex and to mother's left; Baby B is breech to mother's right; Anterior placenta    Labs: Results for orders placed or performed during the hospital encounter of 09/27/16 (from  the past 24 hour(s))  Urinalysis, Routine w reflex microscopic     Status: Abnormal   Collection Time: 09/27/16  9:21 AM  Result Value Ref Range   Color, Urine YELLOW YELLOW   APPearance CLOUDY (A) CLEAR   Specific Gravity, Urine 1.010 1.005 - 1.030   pH 5.0 5.0 - 8.0   Glucose, UA NEGATIVE NEGATIVE mg/dL   Hgb urine dipstick SMALL (A) NEGATIVE   Bilirubin Urine NEGATIVE NEGATIVE   Ketones, ur 20 (A) NEGATIVE mg/dL   Protein, ur NEGATIVE NEGATIVE mg/dL   Nitrite NEGATIVE NEGATIVE   Leukocytes, UA NEGATIVE NEGATIVE   RBC / HPF 0-5 0 - 5 RBC/hpf   WBC, UA 0-5 0 - 5 WBC/hpf   Bacteria, UA RARE (A) NONE SEEN   Squamous Epithelial / LPF 6-30 (A) NONE SEEN   Mucous PRESENT     Imaging:  [place] Mfm Fetal Bpp Wo Non Stress  Result Date: 09/22/2016 ----------------------------------------------------------------------  OBSTETRICS REPORT                        (Corrected Final 09/22/2016 12:55                                                                          pm) ---------------------------------------------------------------------- Patient Info  ID #:       [id number]                         D.O.B.:   04/22/1990 (26 yrs)  Name:       Danielle Cisneros                  Visit Date:  09/22/2016 12:13 pm ---------------------------------------------------------------------- Performed By  Performed By:     Felecia Jan        Secondary Phy.:   [facility]                                                             [facility]care                                                             [employer] logistics/support/administrative officer)  Attending:        Griffin Dakin MD         Address:          [address]                                                             [address] [place]                                                             [place]  Referred By:      Charna Archer             Location:         [facility] [place] [facility]  Ref. Address:     [place]                    [place]                    [place] ---------------------------------------------------------------------- Orders   #  Description                                 Code   1  [place] MFM OB FOLLOW UP  01027.25   2  [place] MFM OB FOLLOW UP ADDL GEST               36644.03   3  [place] MFM FETAL BPP WO NST ADDL                47425.9      GESTATION   4  [place] MFM FETAL BPP WO NON STRESS              76819.01  ----------------------------------------------------------------------   #  Ordered By               Order #        Accession #    Episode #   1  [place]            [id number]      [id number]     [id number]   2  [place]            [id number]      [id number]     [id number]   3  [place]            [id number]      [id number]     [id number]   4  [place]            [id number]      [id number]     [id number]  ---------------------------------------------------------------------- Indications   [redacted] weeks gestation of pregnancy                Z3A.24   Twin pregnancy, mono/di, third trimester       N27.782   Obesity complicating pregnancy, third          O99.212   trimester (BMI 40)   Uterine fibroids affecting pregnancy in third  O34.13, D25.9   trimester, antepartum  ---------------------------------------------------------------------- OB History  Blood Type:            Height:  5'2"   Weight (lb):  224      BMI:   40.97  Gravidity:    2         Term:   0        Prem:   0        SAB:   1  TOP:          0       Ectopic:  0        Living: 0 ---------------------------------------------------------------------- Fetal Evaluation (Fetus A)  Num Of Fetuses:     2  Fetal Heart         135  Rate(bpm):  Cardiac Activity:    Observed  Fetal Lie:          Left Fetus  Presentation:       Cephalic  Placenta:           Anterior, above cervical os  Membrane Desc:      Dividing Membrane seen  Amniotic Fluid  AFI FV:      Subjectively within normal limits                              Largest Pocket(cm)                              5.6 ---------------------------------------------------------------------- Biophysical Evaluation (Fetus A)  Amniotic F.V:   Within normal limits  F. Tone:        Observed  F. Movement:    Observed                   Score:          8/8  F. Breathing:   Observed ---------------------------------------------------------------------- Biometry (Fetus A)  BPD:      83.5  mm     G. Age:  33w 4d         35  %    CI:        78.88   %   70 - 86                                                          FL/HC:      21.3   %   19.4 - 21.8  HC:      297.3  mm     G. Age:  33w 0d          4  %    HC/AC:      1.02       0.96 - 1.11  AC:      291.5  mm     G. Age:  33w 1d         29  %    FL/BPD:     75.7   %   71 - 87  FL:       63.2  mm     G. Age:  32w 5d         12  %    FL/AC:      21.7   %   20 - 24  HUM:      57.3  mm     G. Age:  33w 2d         42  %  Est. FW:    2103  gm    4 lb 10 oz      41  %     FW Discordancy         4  % ---------------------------------------------------------------------- Gestational Age (Fetus A)  LMP:           34w 0d       Date:   01/28/16                 EDD:   11/03/16  Clinical EDD:  34w 0d                                        EDD:   11/03/16  U/S Today:     33w 1d                                        EDD:   11/09/16  Best:          34w 0d    Det. By:   LMP  (01/28/16)          EDD:   11/03/16 ---------------------------------------------------------------------- Anatomy (Fetus A)  Cranium:  Appears normal         Aortic Arch:            Previously seen  Cavum:                 Previously seen        Ductal Arch:            Previously seen  Ventricles:            Previously  seen        Diaphragm:              Previously seen  Choroid Plexus:        Appears normal         Stomach:                Appears normal, left                                                                        sided  Cerebellum:            Previously seen        Abdomen:                Appears normal  Posterior Fossa:       Previously seen        Abdominal Wall:         Previously seen  Nuchal Fold:           Previously seen        Cord Vessels:           Previously seen  Face:                  Orbits and profile     Kidneys:                Appear normal                         previously seen  Lips:                  Previously seen        Bladder:                Appears normal  Thoracic:              Appears normal         Spine:                  Previously seen  Heart:                 EIF Previously seen    Upper Extremities:      Previously seen  RVOT:                  Previously seen        Lower Extremities:      Previously seen  LVOT:                  Previously seen  Other:  Fetus appears to be a female.  Heels and Right 5th digit previously  visualized. Nasal bone previously visualized. Technically difficult due          to maternal habitus and fetal position. ---------------------------------------------------------------------- Fetal Evaluation (Fetus B)  Num Of Fetuses:     2  Fetal Heart         145  Rate(bpm):  Cardiac Activity:   Observed  Fetal Lie:          Upper Fetus  Presentation:       Transverse, head to maternal left  Placenta:           Anterior, above cervical os  Membrane Desc:      Dividing Membrane seen  Amniotic Fluid  AFI FV:      Subjectively within normal limits                              Largest Pocket(cm)                              8.3 ---------------------------------------------------------------------- Biophysical Evaluation (Fetus B)  Amniotic F.V:   Within normal limits       F. Tone:        Observed  F. Movement:    Observed                   Score:           8/8  F. Breathing:   Observed ---------------------------------------------------------------------- Biometry (Fetus B)  BPD:      82.9  mm     G. Age:  33w 3d         28  %    CI:        76.14   %   70 - 86                                                          FL/HC:      20.8   %   19.4 - 21.8  HC:      301.1  mm     G. Age:  33w 3d          8  %    HC/AC:      1.00       0.96 - 1.11  AC:      300.5  mm     G. Age:  34w 0d         54  %    FL/BPD:     75.4   %   71 - 87  FL:       62.5  mm     G. Age:  32w 2d          8  %    FL/AC:      20.8   %   20 - 24  HUM:      54.8  mm     G. Age:  31w 6d         14  %  Est. FW:    2192  gm    4 lb 13 oz      48  %     FW Discordancy      0 \ 4 % ----------------------------------------------------------------------  Gestational Age (Fetus B)  LMP:           34w 0d       Date:   01/28/16                 EDD:   11/03/16  Clinical EDD:  34w 0d                                        EDD:   11/03/16  U/S Today:     33w 2d                                        EDD:   11/08/16  Best:          34w 0d    Det. By:   LMP  (01/28/16)          EDD:   11/03/16 ---------------------------------------------------------------------- Anatomy (Fetus B)  Cranium:               Appears normal         Aortic Arch:            Previously seen  Cavum:                 Previously seen        Ductal Arch:            Previously seen  Ventricles:            Previously seen        Diaphragm:              Previously seen  Choroid Plexus:        Previously seen        Stomach:                Appears normal, left                                                                        sided  Cerebellum:            Previously seen        Abdomen:                Appears normal  Posterior Fossa:       Previously seen        Abdominal Wall:         Previously seen  Nuchal Fold:           Previously seen        Cord Vessels:           Previously seen  Face:                  Orbits and profile     Kidneys:                 Appear normal                         previously seen  Lips:  Previously seen        Bladder:                Appears normal  Thoracic:              Appears normal         Spine:                  Previously seen  Heart:                 Previously seen        Upper Extremities:      Previously seen  RVOT:                  Previously seen        Lower Extremities:      Previously seen  LVOT:                  Previously seen  Other:  Fetus appears to be a female. Heels and Right 5th digit previously          visualized. Technically difficult due to maternal habitus and fetal          position. ---------------------------------------------------------------------- Cervix Uterus Adnexa  Cervix  Not visualized (advanced GA >29wks) ---------------------------------------------------------------------- Myomas   Site                     L(cm)      W(cm)      D(cm)      Location   Rt lat                   11.5       7          9.8        Subserosal  ----------------------------------------------------------------------   Blood Flow                 RI        PI       Comments  ---------------------------------------------------------------------- Impression  Mochorionic diamniotic twin gestation at 34+[redacted] weeks  gestation, here for BPP and growth scan  Twin A:  Maternal left, Cephalic, Anterior placenta  Normal interval anatomy  Normal amniotic fluid volume with MVP 5.6cm  Ed Blalock is normal in the 41st percentile  BPP 8/8  Twin B:  Maternal right, transverse, Anterior placenta  Interval anatomy normal  Normal amniotic fluid volume with MVP 8cm  Normal growth in the 48th percentile  BPP 8/8  No evidence of TTTS either twin  Discordance is 4% ---------------------------------------------------------------------- Recommendations  Continue antenatal testing and [place] as ordered ----------------------------------------------------------------------               Vance Peper, BS, RDMS, RVT Electronically Signed  Corrected Final Report  09/22/2016 12:55 pm ----------------------------------------------------------------------  [place] Mfm Fetal Bpp Wo Non Stress  Result Date: 09/16/2016 ----------------------------------------------------------------------  OBSTETRICS REPORT                      (Signed Final 09/16/2016 01:08 pm) ---------------------------------------------------------------------- Patient Info  ID #:       [id number]                         D.O.B.:   [birth year] (26 yrs)  Name:       Danielle Cisneros                  Visit Date:  09/16/2016 12:23 pm ---------------------------------------------------------------------- Performed By  Performed By:     Jeanene Erb BS,      Secondary Phy.:   [facility]                                                             [employer]                                                             [phone])  Attending:        Renella Cunas MD       Address:          [address] [place]                                                             [place]  Referred By:      Ernst Bowler A             Location:  [facility] [place]  Ref. Address:     [place]                    [place]                    [place] ---------------------------------------------------------------------- Orders   #  Description                                 Code   1  [place] MFM FETAL BPP WO NON STRESS              76819.01   2  [place] MFM FETAL BPP WO NST ADDL                94854.6      GESTATION  ----------------------------------------------------------------------   #  Ordered By                Order #        Accession #    Episode #   1  [place]           [id number]      [id number]     [id number]   2  [place]           [id number]      [id number]     [id number]  ---------------------------------------------------------------------- Indications   [redacted] weeks gestation of pregnancy                Z3A.33   Twin pregnancy, mono/di, third trimester       G86.761   Obesity complicating pregnancy, third          O99.212   trimester (BMI 40)   Uterine fibroids affecting pregnancy in third  O34.13, D25.9   trimester, antepartum   Non-reactive NST                               O28.9  ---------------------------------------------------------------------- OB History  Blood Type:            Height:  5'2"   Weight (lb):  224      BMI:   40.97  Gravidity:    2         Term:   0        Prem:   0        SAB:   1  TOP:          0       Ectopic:  0        Living: 0 ---------------------------------------------------------------------- Fetal Evaluation (Fetus A)  Num Of Fetuses:     2  Fetal Heart         149  Rate(bpm):  Cardiac Activity:   Observed  Fetal Lie:          Maternal left side  Presentation:       Cephalic  Placenta:           Anterior, above cervical os  P. Cord Insertion:  Previously Visualized  Membrane Desc:      Dividing Membrane seen - Monochorionic  Amniotic Fluid  AFI FV:      Subjectively within normal limits                              Largest Pocket(cm)                              5.5 ---------------------------------------------------------------------- Biophysical Evaluation (Fetus A)  Amniotic F.V:   Pocket => 2 cm two         F. Tone:        Observed                  planes  F. Movement:    Observed                   Score:          8/8  F. Breathing:   Observed ---------------------------------------------------------------------- Gestational Age (Fetus A)  LMP:           33w 1d       Date:   01/28/16                 EDD:   11/03/16  Clinical EDD:  33w 1d                                         EDD:   11/03/16  Best:          33w 1d    Det. By:   LMP  (01/28/16)          EDD:   11/03/16 ---------------------------------------------------------------------- Fetal Evaluation (Fetus B)  Num Of Fetuses:     2  Fetal Heart         129  Rate(bpm):  Cardiac Activity:   Observed  Fetal Lie:          Maternal right side  Presentation:       Breech  Placenta:           Anterior, above cervical os  P. Cord Insertion:  Previously Visualized  Membrane Desc:      Dividing Membrane seen - Monochorionic  Amniotic Fluid  AFI FV:      Subjectively within normal limits                              Largest Pocket(cm)                              6.7 ---------------------------------------------------------------------- Biophysical Evaluation (Fetus B)  Amniotic F.V:   Pocket => 2 cm two         F. Tone:        Observed                  planes  F. Movement:    Observed                   Score:          8/8  F. Breathing:   Observed ---------------------------------------------------------------------- Gestational Age (Fetus B)  LMP:           33w 1d       Date:   01/28/16  EDD:   11/03/16  Clinical EDD:  33w 1d                                        EDD:   11/03/16  Best:          33w 1d    Det. By:   LMP  (01/28/16)          EDD:   11/03/16 ---------------------------------------------------------------------- Impression  Monochorionic/diamniotic twin pregnancy at 11+9 weeks  Cephalic/breech presentation  Normal amniotic fluid volume x 2  BPPs 8/8 x 2 ---------------------------------------------------------------------- Recommendations  Continue twice weekly NSTs with weekly AFIs or weekly BPPs  Growth [place] on 06/20 ----------------------------------------------------------------------                 Renella Cunas, MD Electronically Signed Final Report   09/16/2016 01:08 pm ----------------------------------------------------------------------  [place] Mfm Fetal Bpp Wo Non Stress  Result Date:  09/14/2016 ----------------------------------------------------------------------  OBSTETRICS REPORT                      (Signed Final 09/14/2016 02:09 pm) ---------------------------------------------------------------------- Patient Info  ID #:       [id number]                         D.O.B.:   1990-06-07 (26 yrs)  Name:       Danielle Cisneros                  Visit Date:  09/14/2016 10:44 am ---------------------------------------------------------------------- Performed By  Performed By:     Hubert Azure          Secondary Phy.:   [facility]                                                             [place]                                                             [place])  Attending:        Renella Cunas MD       Address:          [phone]  [employer]                                                             [address] [place]                                                             [place]  Referred By:      Charna Archer             Location:         [facility] [place] [facility]  Ref. Address:     [place]                    [place]                    [place] ---------------------------------------------------------------------- Orders   #  Description                                 Code   1  [place] MFM FETAL BPP WO NON STRESS              76819.01   2  [place] MFM FETAL BPP WO NST ADDL                93818.2      GESTATION  ----------------------------------------------------------------------   #  Ordered By               Order #        Accession #    Episode #   1  [place]           [id number]      [id number]     [id number]   2  [place]           [id number]       [id number]     [id number]  ---------------------------------------------------------------------- Indications   [redacted] weeks gestation of pregnancy                Z3A.31   Twin pregnancy, mono/di, third  trimester       O87.867   Obesity complicating pregnancy, third          O99.212   trimester (BMI 40)   Uterine fibroids affecting pregnancy in third  O34.13, D25.9   trimester, antepartum  ---------------------------------------------------------------------- OB History  Blood Type:            Height:  5'2"   Weight (lb):  224      BMI:   40.97  Gravidity:    2         Term:   0        Prem:   0        SAB:   1  TOP:          0       Ectopic:  0        Living: 0 ---------------------------------------------------------------------- Fetal Evaluation (Fetus A)  Num Of Fetuses:     2  Fetal Heart         132  Rate(bpm):  Cardiac Activity:   Observed  Fetal Lie:          Maternal left side  Presentation:       Cephalic  Placenta:           Anterior, above cervical os  P. Cord Insertion:  Previously Visualized  Membrane Desc:      Dividing Membrane seen - Monochorionic  Amniotic Fluid  AFI FV:      Subjectively within normal limits                              Largest Pocket(cm)                              6.48 ---------------------------------------------------------------------- Biophysical Evaluation (Fetus A)  Amniotic F.V:   Within normal limits       F. Tone:        Observed  F. Movement:    Observed                   Score:          8/8  F. Breathing:   Observed ---------------------------------------------------------------------- Gestational Age (Fetus A)  LMP:           32w 6d       Date:   01/28/16                 EDD:   11/03/16  Clinical EDD:  32w 6d                                        EDD:   11/03/16  Best:          32w 6d    Det. By:   LMP  (01/28/16)          EDD:   11/03/16 ---------------------------------------------------------------------- Fetal Evaluation (Fetus B)  Num Of Fetuses:     2  Fetal  Heart         125  Rate(bpm):  Cardiac Activity:   Observed  Fetal Lie:          Maternal right side  Presentation:       Breech  Placenta:           Anterior, above cervical os  P. Cord Insertion:  Previously Visualized  Membrane Desc:      Dividing Membrane seen - Monochorionic  Amniotic Fluid  AFI FV:      Subjectively within normal limits                              Largest Pocket(cm)                              6.97 ---------------------------------------------------------------------- Biophysical Evaluation (Fetus B)  Amniotic F.V:   Within normal limits       F. Tone:        Observed  F. Movement:    Observed                   Score:          8/8  F. Breathing:   Observed ---------------------------------------------------------------------- Gestational Age (Fetus B)  LMP:           32w 6d       Date:   01/28/16                 EDD:   11/03/16  Clinical EDD:  32w 6d                                        EDD:   11/03/16  Best:          32w 6d    Det. By:   LMP  (01/28/16)          EDD:   11/03/16 ---------------------------------------------------------------------- Impression  Monochorionic/diamniotic twin pregnancy at 45+3 weeks  Cephalic/breech presentation  Normal amniotic fluid volume x 2  BPPs 8/8 x 2 ---------------------------------------------------------------------- Recommendations  Continue antenatal testing  Growth USs on 06/20 ----------------------------------------------------------------------                 Renella Cunas, MD Electronically Signed Final Report   09/14/2016 02:09 pm ----------------------------------------------------------------------  [place] Mfm Fetal Bpp Wo Non Stress  Result Date: 09/08/2016 ----------------------------------------------------------------------  OBSTETRICS REPORT                      (Signed Final 09/08/2016 04:04 pm) ---------------------------------------------------------------------- Patient Info  ID #:       [id number]                         D.O.B.:    02/28/1991 (26 yrs)  Name:       Danielle Cisneros                  Visit Date:  09/08/2016 03:24 pm ---------------------------------------------------------------------- Performed By  Performed By:     Rodrigo Ran BS      Secondary Phy.:   [facility]                                                             [facility]                                                             [employer]/support/administrative officer)  Attending:        Griffin Dakin MD         Address:          [address]                                                             [address] [place]                                                             [place]  Referred By:      Charna Archer             Location:         [facility]  Ref. Address:     [place]                    [place]                    [place] ---------------------------------------------------------------------- Orders   #  Description                                 Code   1  [place] MFM OB LIMITED  19379.02   2  [place] MFM FETAL BPP WO NON STRESS              76819.01   3  [place] MFM FETAL BPP WO NST ADDL                40973.5      GESTATION  ----------------------------------------------------------------------   #  Ordered By               Order #        Accession #    Episode #   1  [place]            [id number]      [id number]     [id number]   2  [place]           [id number]      [id number]     [id number]   3  [place]           [id number]      [id number]     [id number]  ---------------------------------------------------------------------- Indications   [redacted] weeks gestation of pregnancy                 Z3A.48   Twin pregnancy, mono/di, third trimester       U76.546   Obesity complicating pregnancy, third          O99.212   trimester (BMI 40)   Uterine fibroids affecting pregnancy in third  O34.13, D25.9   trimester, antepartum  ---------------------------------------------------------------------- OB History  Blood Type:            Height:  5'2"   Weight (lb):  224      BMI:   40.97  Gravidity:    2         Term:   0        Prem:   0        SAB:   1  TOP:          0       Ectopic:  0        Living: 0 ---------------------------------------------------------------------- Fetal Evaluation (Fetus A)  Num Of Fetuses:     2  Fetal Heart         155  Rate(bpm):  Cardiac Activity:   Observed  Fetal Lie:          Maternal left side  Presentation:       Cephalic  Placenta:           Anterior, above cervical os  P. Cord Insertion:  Visualized  Membrane Desc:      Dividing Membrane seen - Monochorionic  Amniotic Fluid  AFI FV:      Subjectively within normal limits                              Largest Pocket(cm)                              3.58 ---------------------------------------------------------------------- Biophysical Evaluation (Fetus A)  Amniotic F.V:   Within normal limits       F. Tone:        Observed  F. Movement:    Observed                   Score:          8/8  F. Breathing:   Observed ---------------------------------------------------------------------- Gestational Age (Fetus A)  LMP:           32w 0d       Date:   01/28/16                 EDD:   11/03/16  Clinical EDD:  Milderd Meager 0d                                        EDD:   11/03/16  Best:          32w 0d    Det. By:   LMP  (01/28/16)          EDD:   11/03/16 ---------------------------------------------------------------------- Fetal Evaluation (Fetus B)  Num Of Fetuses:     2  Fetal Heart         123  Rate(bpm):  Cardiac Activity:   Observed  Fetal Lie:          Maternal right side  Presentation:       Breech  Placenta:           Anterior, above cervical  os  P. Cord Insertion:  Marginal insertion  Membrane Desc:      Dividing Membrane seen - Monochorionic  Amniotic Fluid  AFI FV:      Subjectively within normal limits                              Largest Pocket(cm)                              6.07 ---------------------------------------------------------------------- Biophysical Evaluation (Fetus B)  Amniotic F.V:   Within normal limits       F. Tone:        Observed  F. Movement:    Observed                   Score:          8/8  F. Breathing:   Observed ---------------------------------------------------------------------- Gestational Age (Fetus B)  LMP:           32w 0d       Date:   01/28/16                 EDD:   11/03/16  Clinical EDD:  Milderd Meager 0d                                        EDD:   11/03/16  Best:          32w 0d    Det. By:   LMP  (01/28/16)          EDD:   11/03/16 ---------------------------------------------------------------------- Impression  Mochorionic diamniotic twin gestation at 32+[redacted] weeks  gestation, here for BPP and TTTS check  Twin A:  Maternal left, Cephalic, Anterior placenta  Fetal bladder visualized  Normal amniotic fluid volume with MVP 3.6cm  BPP 8/8  Twin B:  Maternal right, Breech, Anterior placenta  Fetal bladder visualized  Normal amniotic fluid volume with MVP 6.1cm  BPP 8/8  No evidence of TTTS either twin ---------------------------------------------------------------------- Recommendations  Repeat evaluation in 2 weeks ----------------------------------------------------------------------  Griffin Dakin, MD Electronically Signed Final Report   09/08/2016 04:04 pm ----------------------------------------------------------------------  [place] Mfm Ob Follow Up  Result Date: 09/22/2016 ----------------------------------------------------------------------  OBSTETRICS REPORT                        (Corrected Final 09/22/2016 12:55                                                                          pm)  ---------------------------------------------------------------------- Patient Info  ID #:       [id number]                         D.O.B.:   March 21, 1991 (26 yrs)  Name:       Danielle Cisneros                  Visit Date:  09/22/2016 12:13 pm ---------------------------------------------------------------------- Performed By  Performed By:     Felecia Jan        Secondary Phy.:   [facility]                                                             [place]                                                             [phone])  Attending:        Griffin Dakin MD         Address:          [address]  [address] [place]  Referred By:      Charna Archer             Location:         [facility]  Ref. Address:     [place]                    [place]                    [place] ---------------------------------------------------------------------- Orders   #  Description                                 Code   1  [place] MFM OB FOLLOW UP                         [id number]   2  [place] MFM OB FOLLOW UP ADDL GEST               83382.50   3  [place] MFM FETAL BPP WO NST ADDL                53976.7      GESTATION   4  [place] MFM FETAL BPP WO NON STRESS              76819.01  ----------------------------------------------------------------------   #  Ordered By               Order #        Accession #    Episode #   1  [place]            [id number]      [id number]     [id number]   2  [place]            [id number]      [id number]     [id number]   3  [place]            [id number]      [id number]     [id number]   4  [place]            [id number]      [id number]     [id number]  ---------------------------------------------------------------------- Indications   [redacted] weeks gestation of pregnancy                Z3A.27   Twin pregnancy, mono/di, third trimester  Y09.983   Obesity complicating pregnancy, third          O99.212   trimester (BMI 40)   Uterine fibroids affecting pregnancy in third  O34.13, D25.9   trimester, antepartum  ---------------------------------------------------------------------- OB History  Blood Type:            Height:  5'2"   Weight (lb):  224      BMI:   40.97  Gravidity:    2         Term:   0        Prem:   0        SAB:   1  TOP:          0       Ectopic:  0        Living: 0 ---------------------------------------------------------------------- Fetal Evaluation (Fetus A)  Num Of Fetuses:     2  Fetal Heart         135  Rate(bpm):  Cardiac Activity:   Observed  Fetal Lie:          Left Fetus  Presentation:       Cephalic  Placenta:           Anterior, above cervical os  Membrane Desc:      Dividing Membrane seen  Amniotic Fluid  AFI FV:      Subjectively within normal limits                              Largest Pocket(cm)                              5.6 ---------------------------------------------------------------------- Biophysical Evaluation (Fetus A)  Amniotic F.V:   Within normal limits       F. Tone:        Observed  F. Movement:    Observed                   Score:          8/8  F. Breathing:   Observed ---------------------------------------------------------------------- Biometry (Fetus A)  BPD:      83.5  mm     G. Age:  33w 4d         35  %    CI:        78.88   %   70 - 86                                                          FL/HC:      21.3   %   19.4 - 21.8  HC:      297.3  mm     G. Age:  33w 0d          4  %    HC/AC:      1.02       0.96 - 1.11  AC:      291.5  mm     G. Age:  33w 1d         29  %    FL/BPD:      75.7   %   71 - 87  FL:  63.2  mm     G. Age:  32w 5d         12  %    FL/AC:      21.7   %   20 - 24  HUM:      57.3  mm     G. Age:  33w 2d         42  %  Est. FW:    2103  gm    4 lb 10 oz      41  %     FW Discordancy         4  % ---------------------------------------------------------------------- Gestational Age (Fetus A)  LMP:           34w 0d       Date:   01/28/16                 EDD:   11/03/16  Clinical EDD:  34w 0d                                        EDD:   11/03/16  U/S Today:     33w 1d                                        EDD:   11/09/16  Best:          34w 0d    Det. By:   LMP  (01/28/16)          EDD:   11/03/16 ---------------------------------------------------------------------- Anatomy (Fetus A)  Cranium:               Appears normal         Aortic Arch:            Previously seen  Cavum:                 Previously seen        Ductal Arch:            Previously seen  Ventricles:            Previously seen        Diaphragm:              Previously seen  Choroid Plexus:        Appears normal         Stomach:                Appears normal, left                                                                        sided  Cerebellum:            Previously seen        Abdomen:                Appears normal  Posterior Fossa:       Previously seen        Abdominal Wall:  Previously seen  Nuchal Fold:           Previously seen        Cord Vessels:           Previously seen  Face:                  Orbits and profile     Kidneys:                Appear normal                         previously seen  Lips:                  Previously seen        Bladder:                Appears normal  Thoracic:              Appears normal         Spine:                  Previously seen  Heart:                 EIF Previously seen    Upper Extremities:      Previously seen  RVOT:                  Previously seen        Lower Extremities:      Previously seen  LVOT:                  Previously seen  Other:   Fetus appears to be a female.  Heels and Right 5th digit previously          visualized. Nasal bone previously visualized. Technically difficult due          to maternal habitus and fetal position. ---------------------------------------------------------------------- Fetal Evaluation (Fetus B)  Num Of Fetuses:     2  Fetal Heart         145  Rate(bpm):  Cardiac Activity:   Observed  Fetal Lie:          Upper Fetus  Presentation:       Transverse, head to maternal left  Placenta:           Anterior, above cervical os  Membrane Desc:      Dividing Membrane seen  Amniotic Fluid  AFI FV:      Subjectively within normal limits                              Largest Pocket(cm)                              8.3 ---------------------------------------------------------------------- Biophysical Evaluation (Fetus B)  Amniotic F.V:   Within normal limits       F. Tone:        Observed  F. Movement:    Observed                   Score:          8/8  F. Breathing:   Observed ---------------------------------------------------------------------- Biometry (Fetus B)  BPD:      82.9  mm     G. Age:  33w 3d  28  %    CI:        76.14   %   70 - 86                                                          FL/HC:      20.8   %   19.4 - 21.8  HC:      301.1  mm     G. Age:  33w 3d          8  %    HC/AC:      1.00       0.96 - 1.11  AC:      300.5  mm     G. Age:  34w 0d         54  %    FL/BPD:     75.4   %   71 - 87  FL:       62.5  mm     G. Age:  32w 2d          8  %    FL/AC:      20.8   %   20 - 24  HUM:      54.8  mm     G. Age:  31w 6d         14  %  Est. FW:    2192  gm    4 lb 13 oz      48  %     FW Discordancy      0 \ 4 % ---------------------------------------------------------------------- Gestational Age (Fetus B)  LMP:           34w 0d       Date:   01/28/16                 EDD:   11/03/16  Clinical EDD:  34w 0d                                        EDD:   11/03/16  U/S Today:     33w 2d                                         EDD:   11/08/16  Best:          34w 0d    Det. By:   LMP  (01/28/16)          EDD:   11/03/16 ---------------------------------------------------------------------- Anatomy (Fetus B)  Cranium:               Appears normal         Aortic Arch:            Previously seen  Cavum:                 Previously seen        Ductal Arch:            Previously seen  Ventricles:            Previously seen  Diaphragm:              Previously seen  Choroid Plexus:        Previously seen        Stomach:                Appears normal, left                                                                        sided  Cerebellum:            Previously seen        Abdomen:                Appears normal  Posterior Fossa:       Previously seen        Abdominal Wall:         Previously seen  Nuchal Fold:           Previously seen        Cord Vessels:           Previously seen  Face:                  Orbits and profile     Kidneys:                Appear normal                         previously seen  Lips:                  Previously seen        Bladder:                Appears normal  Thoracic:              Appears normal         Spine:                  Previously seen  Heart:                 Previously seen        Upper Extremities:      Previously seen  RVOT:                  Previously seen        Lower Extremities:      Previously seen  LVOT:                  Previously seen  Other:  Fetus appears to be a female. Heels and Right 5th digit previously          visualized. Technically difficult due to maternal habitus and fetal          position. ---------------------------------------------------------------------- Cervix Uterus Adnexa  Cervix  Not visualized (advanced GA >29wks) ---------------------------------------------------------------------- Myomas   Site                     L(cm)      W(cm)      D(cm)      Location   Rt lat  11.5       7          9.8        Subserosal   ----------------------------------------------------------------------   Blood Flow                 RI        PI       Comments  ---------------------------------------------------------------------- Impression  Mochorionic diamniotic twin gestation at 34+[redacted] weeks  gestation, here for BPP and growth scan  Twin A:  Maternal left, Cephalic, Anterior placenta  Normal interval anatomy  Normal amniotic fluid volume with MVP 5.6cm  Ed Blalock is normal in the 41st percentile  BPP 8/8  Twin B:  Maternal right, transverse, Anterior placenta  Interval anatomy normal  Normal amniotic fluid volume with MVP 8cm  Normal growth in the 48th percentile  BPP 8/8  No evidence of TTTS either twin  Discordance is 4% ---------------------------------------------------------------------- Recommendations  Continue antenatal testing and [place] as ordered ----------------------------------------------------------------------               Vance Peper, BS, RDMS, RVT Electronically Signed Corrected Final Report  09/22/2016 12:55 pm ----------------------------------------------------------------------  [place] Mfm Ob Follow Up Addl Gest  Result Date: 09/22/2016 ----------------------------------------------------------------------  OBSTETRICS REPORT                        (Corrected Final 09/22/2016 12:55                                                                          pm) ---------------------------------------------------------------------- Patient Info  ID #:       [id number]                         D.O.B.:   05-06-1990 (26 yrs)  Name:       Danielle Cisneros                  Visit Date:  09/22/2016 12:13 pm ---------------------------------------------------------------------- Performed By  Performed By:     Felecia Jan        Secondary Phy.:   [facility]                                                             [facility]                                                             [phone])  Attending:        Griffin Dakin MD         Address:          [address]                                                             [address] [place]                                                             [place]                                                             [place]  Referred By:      Charna Archer             Location:         [facility] [place] [facility]  Ref. Address:     [place]                    [place]                    [place] ---------------------------------------------------------------------- Orders   #  Description                                 Code   1  [place] MFM OB FOLLOW UP                         [id number]   2  [place] MFM OB FOLLOW UP ADDL GEST               38756.43   3  [place] MFM FETAL BPP WO NST ADDL                [id number]  GESTATION   4  [place] MFM FETAL BPP WO NON STRESS              [id number]  ----------------------------------------------------------------------   #  Ordered By               Order #        Accession #    Episode #   1  [place]            [id number]      [id number]     [id number]   2  [place]            [id number]      [id number]     [id number]   3  [place]            [id number]      [id number]     [id number]   4  [place]            [id number]      [id number]     [id number]  ---------------------------------------------------------------------- Indications   [redacted] weeks gestation of pregnancy                Z3A.62   Twin pregnancy, mono/di, third trimester       H60.737   Obesity complicating pregnancy, third          O99.212   trimester (BMI 40)   Uterine fibroids affecting pregnancy in third  O34.13, D25.9   trimester, antepartum  ---------------------------------------------------------------------- OB History  Blood  Type:            Height:  5'2"   Weight (lb):  224      BMI:   40.97  Gravidity:    2         Term:   0        Prem:   0        SAB:   1  TOP:          0       Ectopic:  0        Living: 0 ---------------------------------------------------------------------- Fetal Evaluation (Fetus A)  Num Of Fetuses:     2  Fetal Heart         135  Rate(bpm):  Cardiac Activity:   Observed  Fetal Lie:          Left Fetus  Presentation:       Cephalic  Placenta:           Anterior, above cervical os  Membrane Desc:      Dividing Membrane seen  Amniotic Fluid  AFI FV:      Subjectively within normal limits                              Largest Pocket(cm)                              5.6 ---------------------------------------------------------------------- Biophysical Evaluation (Fetus A)  Amniotic F.V:   Within normal limits       F. Tone:        Observed  F. Movement:    Observed                   Score:          8/8  F. Breathing:   Observed ---------------------------------------------------------------------- Biometry (Fetus A)  BPD:  83.5  mm     G. Age:  33w 4d         35  %    CI:        78.88   %   70 - 86                                                          FL/HC:      21.3   %   19.4 - 21.8  HC:      297.3  mm     G. Age:  33w 0d          4  %    HC/AC:      1.02       0.96 - 1.11  AC:      291.5  mm     G. Age:  33w 1d         29  %    FL/BPD:     75.7   %   71 - 87  FL:       63.2  mm     G. Age:  32w 5d         12  %    FL/AC:      21.7   %   20 - 24  HUM:      57.3  mm     G. Age:  33w 2d         42  %  Est. FW:    2103  gm    4 lb 10 oz      41  %     FW Discordancy         4  % ---------------------------------------------------------------------- Gestational Age (Fetus A)  LMP:           34w 0d       Date:   01/28/16                 EDD:   11/03/16  Clinical EDD:  34w 0d                                        EDD:   11/03/16  U/S Today:     33w 1d                                        EDD:   11/09/16  Best:           34w 0d    Det. By:   LMP  (01/28/16)          EDD:   11/03/16 ---------------------------------------------------------------------- Anatomy (Fetus A)  Cranium:               Appears normal         Aortic Arch:            Previously seen  Cavum:                 Previously seen        Ductal Arch:  Previously seen  Ventricles:            Previously seen        Diaphragm:              Previously seen  Choroid Plexus:        Appears normal         Stomach:                Appears normal, left                                                                        sided  Cerebellum:            Previously seen        Abdomen:                Appears normal  Posterior Fossa:       Previously seen        Abdominal Wall:         Previously seen  Nuchal Fold:           Previously seen        Cord Vessels:           Previously seen  Face:                  Orbits and profile     Kidneys:                Appear normal                         previously seen  Lips:                  Previously seen        Bladder:                Appears normal  Thoracic:              Appears normal         Spine:                  Previously seen  Heart:                 EIF Previously seen    Upper Extremities:      Previously seen  RVOT:                  Previously seen        Lower Extremities:      Previously seen  LVOT:                  Previously seen  Other:  Fetus appears to be a female.  Heels and Right 5th digit previously          visualized. Nasal bone previously visualized. Technically difficult due          to maternal habitus and fetal position. ---------------------------------------------------------------------- Fetal Evaluation (Fetus B)  Num Of Fetuses:     2  Fetal Heart         145  Rate(bpm):  Cardiac Activity:   Observed  Fetal Lie:  Upper Fetus  Presentation:       Transverse, head to maternal left  Placenta:           Anterior, above cervical os  Membrane Desc:      Dividing Membrane seen  Amniotic  Fluid  AFI FV:      Subjectively within normal limits                              Largest Pocket(cm)                              8.3 ---------------------------------------------------------------------- Biophysical Evaluation (Fetus B)  Amniotic F.V:   Within normal limits       F. Tone:        Observed  F. Movement:    Observed                   Score:          8/8  F. Breathing:   Observed ---------------------------------------------------------------------- Biometry (Fetus B)  BPD:      82.9  mm     G. Age:  33w 3d         28  %    CI:        76.14   %   70 - 86                                                          FL/HC:      20.8   %   19.4 - 21.8  HC:      301.1  mm     G. Age:  33w 3d          8  %    HC/AC:      1.00       0.96 - 1.11  AC:      300.5  mm     G. Age:  34w 0d         54  %    FL/BPD:     75.4   %   71 - 87  FL:       62.5  mm     G. Age:  32w 2d          8  %    FL/AC:      20.8   %   20 - 24  HUM:      54.8  mm     G. Age:  31w 6d         14  %  Est. FW:    2192  gm    4 lb 13 oz      48  %     FW Discordancy      0 \ 4 % ---------------------------------------------------------------------- Gestational Age (Fetus B)  LMP:           34w 0d       Date:   01/28/16                 EDD:   11/03/16  Clinical EDD:  34w 0d  EDD:   11/03/16  U/S Today:     33w 2d                                        EDD:   11/08/16  Best:          34w 0d    Det. By:   LMP  (01/28/16)          EDD:   11/03/16 ---------------------------------------------------------------------- Anatomy (Fetus B)  Cranium:               Appears normal         Aortic Arch:            Previously seen  Cavum:                 Previously seen        Ductal Arch:            Previously seen  Ventricles:            Previously seen        Diaphragm:              Previously seen  Choroid Plexus:        Previously seen        Stomach:                Appears normal, left                                                                         sided  Cerebellum:            Previously seen        Abdomen:                Appears normal  Posterior Fossa:       Previously seen        Abdominal Wall:         Previously seen  Nuchal Fold:           Previously seen        Cord Vessels:           Previously seen  Face:                  Orbits and profile     Kidneys:                Appear normal                         previously seen  Lips:                  Previously seen        Bladder:                Appears normal  Thoracic:              Appears normal         Spine:                  Previously seen  Heart:  Previously seen        Upper Extremities:      Previously seen  RVOT:                  Previously seen        Lower Extremities:      Previously seen  LVOT:                  Previously seen  Other:  Fetus appears to be a female. Heels and Right 5th digit previously          visualized. Technically difficult due to maternal habitus and fetal          position. ---------------------------------------------------------------------- Cervix Uterus Adnexa  Cervix  Not visualized (advanced GA >29wks) ---------------------------------------------------------------------- Myomas   Site                     L(cm)      W(cm)      D(cm)      Location   Rt lat                   11.5       7          9.8        Subserosal  ----------------------------------------------------------------------   Blood Flow                 RI        PI       Comments  ---------------------------------------------------------------------- Impression  Mochorionic diamniotic twin gestation at 34+[redacted] weeks  gestation, here for BPP and growth scan  Twin A:  Maternal left, Cephalic, Anterior placenta  Normal interval anatomy  Normal amniotic fluid volume with MVP 5.6cm  Ed Blalock is normal in the 41st percentile  BPP 8/8  Twin B:  Maternal right, transverse, Anterior placenta  Interval anatomy normal  Normal amniotic fluid volume with MVP 8cm   Normal growth in the 48th percentile  BPP 8/8  No evidence of TTTS either twin  Discordance is 4% ---------------------------------------------------------------------- Recommendations  Continue antenatal testing and [place] as ordered ----------------------------------------------------------------------               Vance Peper, BS, RDMS, RVT Electronically Signed Corrected Final Report  09/22/2016 12:55 pm ----------------------------------------------------------------------  [place] Mfm Ob Limited  Result Date: 09/27/2016 ----------------------------------------------------------------------  OBSTETRICS REPORT                      (Signed Final 09/27/2016 02:02 pm) ---------------------------------------------------------------------- Patient Info  ID #:       [id number]                         D.O.B.:   November 26, 1990 (26 yrs)  Name:       Danielle Cisneros                  Visit Date:  09/27/2016 11:17 am ---------------------------------------------------------------------- Performed By  Performed By:     Jeanene Erb BS,      Secondary Phy.:   [facility]                    RDMS  [facility]                                                             [employer]                                                             [phone])  Attending:        Renella Cunas MD       Address:          [address]                                                             [address] [place]                                                             [place]  Referred By:      Jamie Brookes Phy.:    MAU Nursing-                    [facility]                                                             MAU/Triage  Ref. Address:     [address]       Location:          [facility] [address] [place]                    [place] ---------------------------------------------------------------------- Orders   #  Description                                 Code   1  [place] MFM OB LIMITED                           [phone]  ----------------------------------------------------------------------   #  Ordered By               Order #        Accession #    Episode #   1  Katherine Basset          [id number]      [id number]     [id number]  ---------------------------------------------------------------------- Indications   [redacted] weeks gestation of pregnancy                Z3A.71   Twin pregnancy, mono/di, third trimester       W10.932   Obesity complicating pregnancy, third          O99.212   trimester (BMI 40)   Uterine fibroids affecting pregnancy in third  O34.13, D25.9   trimester, antepartum   Abdominal pain in pregnancy                    O99.89  ---------------------------------------------------------------------- OB History  Blood Type:            Height:  5'2"   Weight (lb):  224      BMI:   40.97  Gravidity:    2         Term:   0        Prem:   0        SAB:   1  TOP:          0       Ectopic:  0        Living: 0 ---------------------------------------------------------------------- Fetal Evaluation (Fetus A)  Num Of Fetuses:     2  Fetal Heart         137  Rate(bpm):  Cardiac Activity:   Observed  Fetal Lie:          Left Fetus  Presentation:       Cephalic  Placenta:           Anterior, above cervical os  Membrane Desc:      Dividing Membrane seen - Monochorionic  Amniotic Fluid  AFI FV:      Subjectively within normal limits                              Largest Pocket(cm)                              3.5 ---------------------------------------------------------------------- Gestational Age (Fetus A)  LMP:           34w 5d       Date:  01/28/16                 EDD:   11/03/16  Clinical EDD:  34w 5d                                         EDD:   11/03/16  Best:          34w 5d    Det. By:   LMP  (01/28/16)          EDD:   11/03/16 ---------------------------------------------------------------------- Fetal Evaluation (Fetus B)  Num Of Fetuses:     2  Fetal Heart         126  Rate(bpm):  Cardiac Activity:   Observed  Fetal Lie:          Upper Fetus  Presentation:       Breech  Placenta:           Anterior, above cervical os  Membrane Desc:      Dividing Membrane seen - Monochorionic  Amniotic Fluid  AFI FV:      Subjectively within normal limits                              Largest Pocket(cm)                              4.6  Comment:    No placental abruption identified on today's Ultrasound. ---------------------------------------------------------------------- Gestational Age (Fetus B)  LMP:           34w 5d       Date:   01/28/16                 EDD:   11/03/16  Clinical EDD:  34w 5d                                        EDD:   11/03/16  Best:          34w 5d    Det. By:   LMP  (01/28/16)          EDD:   11/03/16 ---------------------------------------------------------------------- Myomas   Site                     L(cm)      W(cm)      D(cm)      Location   Rt lat                   14.5       8.97       7.3        Intramural  ----------------------------------------------------------------------   Blood Flow                 RI        PI       Comments  ---------------------------------------------------------------------- Impression  Monochorionic/diamniotic twin pregnancy at 83+3 weeks  Cephalic/breech presentation  Normal amniotic fluid volume x 2  Large fibroid visualized again in right, lateral uterus; per  sonographer, pt is tender over this area  Anterior placenta; no previa; no subchorionic fluid  collections/hemorrhage identified ---------------------------------------------------------------------- Recommendations  Follow-up as previously scheduled on 07/05 ----------------------------------------------------------------------  Renella Cunas, MD Electronically Signed Final Report   09/27/2016 02:02 pm ----------------------------------------------------------------------  [place] Mfm Ob Limited  Result Date: 09/08/2016 ----------------------------------------------------------------------  OBSTETRICS REPORT                      (Signed Final 09/08/2016 04:04 pm) ---------------------------------------------------------------------- Patient Info  ID #:       [id number]                         D.O.B.:   14-Jun-1990 (26 yrs)  Name:       Danielle Cisneros                  Visit Date:  09/08/2016 03:24 pm ---------------------------------------------------------------------- Performed By  Performed By:     Rodrigo Ran BS      Secondary Phy.:   [facility]                                                             [place]                                                             [place])  Attending:        Griffin Dakin MD         Address:          [address]                                                             [address] [phone]  [place]                                                             [address]  Referred By:      Charna Archer             Location:         [facility] [place] [facility]  Ref. Address:     [place]                    [place]                    [place] ---------------------------------------------------------------------- Orders   #  Description                                 Code   1  [place] MFM OB LIMITED                           [phone]   2  [place] MFM FETAL BPP WO NON STRESS              76819.01   3  [place] MFM FETAL BPP WO NST ADDL                81017.5      GESTATION   ----------------------------------------------------------------------   #  Ordered By               Order #        Accession #    Episode #   1  [place]            [id number]      [id number]     [id number]   2  [place]           [id number]      [id number]     [id number]   3  [place]           [id number]      [id number]     [id number]  ---------------------------------------------------------------------- Indications   [redacted] weeks gestation of pregnancy                Z3A.40   Twin pregnancy, mono/di, third trimester       T41.962   Obesity complicating pregnancy, third          O99.212   trimester (BMI 40)   Uterine fibroids affecting pregnancy in third  O34.13, D25.9   trimester, antepartum  ---------------------------------------------------------------------- OB History  Blood Type:            Height:  5'2"   Weight (lb):  224      BMI:   40.97  Gravidity:    2         Term:   0        Prem:   0        SAB:   1  TOP:          0       Ectopic:  0        Living: 0 ---------------------------------------------------------------------- Fetal Evaluation (Fetus A)  Num Of Fetuses:     2  Fetal Heart         155  Rate(bpm):  Cardiac Activity:   Observed  Fetal Lie:          Maternal left side  Presentation:       Cephalic  Placenta:           Anterior, above cervical os  P. Cord Insertion:  Visualized  Membrane Desc:      Dividing Membrane seen - Monochorionic  Amniotic Fluid  AFI FV:      Subjectively within normal limits                              Largest Pocket(cm)                              3.58 ---------------------------------------------------------------------- Biophysical Evaluation (Fetus A)  Amniotic F.V:   Within normal limits       F. Tone:        Observed  F. Movement:    Observed                   Score:          8/8  F. Breathing:   Observed ---------------------------------------------------------------------- Gestational Age (Fetus A)  LMP:           32w 0d       Date:   01/28/16                  EDD:   11/03/16  Clinical EDD:  Milderd Meager 0d                                        EDD:   11/03/16  Best:          32w 0d    Det. By:   LMP  (01/28/16)          EDD:   11/03/16 ---------------------------------------------------------------------- Fetal Evaluation (Fetus B)  Num Of Fetuses:     2  Fetal Heart         123  Rate(bpm):  Cardiac Activity:   Observed  Fetal Lie:          Maternal right side  Presentation:       Breech  Placenta:           Anterior, above cervical os  P. Cord Insertion:  Marginal insertion  Membrane Desc:      Dividing Membrane seen - Monochorionic  Amniotic Fluid  AFI FV:      Subjectively within normal limits                              Largest Pocket(cm)                              6.07 ---------------------------------------------------------------------- Biophysical Evaluation (Fetus B)  Amniotic F.V:   Within normal limits       F. Tone:  Observed  F. Movement:    Observed                   Score:          8/8  F. Breathing:   Observed ---------------------------------------------------------------------- Gestational Age (Fetus B)  LMP:           32w 0d       Date:   01/28/16                 EDD:   11/03/16  Clinical EDD:  Milderd Meager 0d                                        EDD:   11/03/16  Best:          32w 0d    Det. By:   LMP  (01/28/16)          EDD:   11/03/16 ---------------------------------------------------------------------- Impression  Mochorionic diamniotic twin gestation at 32+[redacted] weeks  gestation, here for BPP and TTTS check  Twin A:  Maternal left, Cephalic, Anterior placenta  Fetal bladder visualized  Normal amniotic fluid volume with MVP 3.6cm  BPP 8/8  Twin B:  Maternal right, Breech, Anterior placenta  Fetal bladder visualized  Normal amniotic fluid volume with MVP 6.1cm  BPP 8/8  No evidence of TTTS either twin ---------------------------------------------------------------------- Recommendations  Repeat evaluation in 2 weeks  ----------------------------------------------------------------------                 Griffin Dakin, MD Electronically Signed Final Report   09/08/2016 04:04 pm ----------------------------------------------------------------------  [place] Mfm Fetal Bpp Wo Nst Addl Gestation  Result Date: 09/22/2016 ----------------------------------------------------------------------  OBSTETRICS REPORT                        (Corrected Final 09/22/2016 12:55                                                                          pm) ---------------------------------------------------------------------- Patient Info  ID #:       [id number]                         D.O.B.:   09-07-1990 (26 yrs)  Name:       Danielle Cisneros                  Visit Date:  09/22/2016 12:13 pm ---------------------------------------------------------------------- Performed By  Performed By:     Felecia Jan        Secondary Phy.:   [facility]                                                             Healthcare                                                             [employer] logistics/support/administrative officer)  Attending:        Griffin Dakin MD         Address:          [address]                                                             [address] [place]                                                             [place]  Referred By:      Charna Archer             Location:         [facility] [place] [facility]  Ref. Address:     [place]                    [place]                    [place] ---------------------------------------------------------------------- Orders   #  Description                                 Code   1  [place] MFM OB  FOLLOW UP  96222.97   2  [place] MFM OB FOLLOW UP ADDL GEST               98921.19   3  [place] MFM FETAL BPP WO NST ADDL                41740.8      GESTATION   4  [place] MFM FETAL BPP WO NON STRESS              76819.01  ----------------------------------------------------------------------   #  Ordered By               Order #        Accession #    Episode #   1  [place]            [id number]      [id number]     [id number]   2  [place]            [id number]      [id number]     [id number]   3  [place]            [id number]      [id number]     [id number]   4  [place]            [id number]      [id number]     [id number]  ---------------------------------------------------------------------- Indications   [redacted] weeks gestation of pregnancy                Z3A.92   Twin pregnancy, mono/di, third trimester       Z30.076   Obesity complicating pregnancy, third          O99.212   trimester (BMI 40)   Uterine fibroids affecting pregnancy in third  O34.13, D25.9   trimester, antepartum  ---------------------------------------------------------------------- OB History  Blood Type:            Height:  5'2"   Weight (lb):  224      BMI:   40.97  Gravidity:    2         Term:   0        Prem:   0        SAB:   1  TOP:          0       Ectopic:  0        Living: 0 ---------------------------------------------------------------------- Fetal Evaluation (Fetus A)  Num Of Fetuses:     2  Fetal Heart         135  Rate(bpm):  Cardiac Activity:   Observed  Fetal Lie:          Left Fetus  Presentation:       Cephalic  Placenta:           Anterior, above cervical os  Membrane Desc:      Dividing Membrane seen  Amniotic Fluid  AFI FV:      Subjectively within normal limits                              Largest Pocket(cm)                              5.6 ---------------------------------------------------------------------- Biophysical Evaluation (Fetus A)  Amniotic F.V:   Within normal limits  F. Tone:         Observed  F. Movement:    Observed                   Score:          8/8  F. Breathing:   Observed ---------------------------------------------------------------------- Biometry (Fetus A)  BPD:      83.5  mm     G. Age:  33w 4d         35  %    CI:        78.88   %   70 - 86                                                          FL/HC:      21.3   %   19.4 - 21.8  HC:      297.3  mm     G. Age:  33w 0d          4  %    HC/AC:      1.02       0.96 - 1.11  AC:      291.5  mm     G. Age:  33w 1d         29  %    FL/BPD:     75.7   %   71 - 87  FL:       63.2  mm     G. Age:  32w 5d         12  %    FL/AC:      21.7   %   20 - 24  HUM:      57.3  mm     G. Age:  33w 2d         42  %  Est. FW:    2103  gm    4 lb 10 oz      41  %     FW Discordancy         4  % ---------------------------------------------------------------------- Gestational Age (Fetus A)  LMP:           34w 0d       Date:   01/28/16                 EDD:   11/03/16  Clinical EDD:  34w 0d                                        EDD:   11/03/16  U/S Today:     33w 1d                                        EDD:   11/09/16  Best:          34w 0d    Det. By:   LMP  (01/28/16)          EDD:   11/03/16 ---------------------------------------------------------------------- Anatomy (Fetus A)  Cranium:  Appears normal         Aortic Arch:            Previously seen  Cavum:                 Previously seen        Ductal Arch:            Previously seen  Ventricles:            Previously seen        Diaphragm:              Previously seen  Choroid Plexus:        Appears normal         Stomach:                Appears normal, left                                                                        sided  Cerebellum:            Previously seen        Abdomen:                Appears normal  Posterior Fossa:       Previously seen        Abdominal Wall:         Previously seen  Nuchal Fold:           Previously seen        Cord Vessels:            Previously seen  Face:                  Orbits and profile     Kidneys:                Appear normal                         previously seen  Lips:                  Previously seen        Bladder:                Appears normal  Thoracic:              Appears normal         Spine:                  Previously seen  Heart:                 EIF Previously seen    Upper Extremities:      Previously seen  RVOT:                  Previously seen        Lower Extremities:      Previously seen  LVOT:                  Previously seen  Other:  Fetus appears to be a female.  Heels and Right 5th digit previously  visualized. Nasal bone previously visualized. Technically difficult due          to maternal habitus and fetal position. ---------------------------------------------------------------------- Fetal Evaluation (Fetus B)  Num Of Fetuses:     2  Fetal Heart         145  Rate(bpm):  Cardiac Activity:   Observed  Fetal Lie:          Upper Fetus  Presentation:       Transverse, head to maternal left  Placenta:           Anterior, above cervical os  Membrane Desc:      Dividing Membrane seen  Amniotic Fluid  AFI FV:      Subjectively within normal limits                              Largest Pocket(cm)                              8.3 ---------------------------------------------------------------------- Biophysical Evaluation (Fetus B)  Amniotic F.V:   Within normal limits       F. Tone:        Observed  F. Movement:    Observed                   Score:          8/8  F. Breathing:   Observed ---------------------------------------------------------------------- Biometry (Fetus B)  BPD:      82.9  mm     G. Age:  33w 3d         28  %    CI:        76.14   %   70 - 86                                                          FL/HC:      20.8   %   19.4 - 21.8  HC:      301.1  mm     G. Age:  33w 3d          8  %    HC/AC:      1.00       0.96 - 1.11  AC:      300.5  mm     G. Age:  34w 0d         54  %    FL/BPD:     75.4    %   71 - 87  FL:       62.5  mm     G. Age:  32w 2d          8  %    FL/AC:      20.8   %   20 - 24  HUM:      54.8  mm     G. Age:  31w 6d         14  %  Est. FW:    2192  gm    4 lb 13 oz      48  %     FW Discordancy      0 \ 4 % ----------------------------------------------------------------------  Gestational Age (Fetus B)  LMP:           34w 0d       Date:   01/28/16                 EDD:   11/03/16  Clinical EDD:  34w 0d                                        EDD:   11/03/16  U/S Today:     33w 2d                                        EDD:   11/08/16  Best:          34w 0d    Det. By:   LMP  (01/28/16)          EDD:   11/03/16 ---------------------------------------------------------------------- Anatomy (Fetus B)  Cranium:               Appears normal         Aortic Arch:            Previously seen  Cavum:                 Previously seen        Ductal Arch:            Previously seen  Ventricles:            Previously seen        Diaphragm:              Previously seen  Choroid Plexus:        Previously seen        Stomach:                Appears normal, left                                                                        sided  Cerebellum:            Previously seen        Abdomen:                Appears normal  Posterior Fossa:       Previously seen        Abdominal Wall:         Previously seen  Nuchal Fold:           Previously seen        Cord Vessels:           Previously seen  Face:                  Orbits and profile     Kidneys:                Appear normal                         previously seen  Lips:  Previously seen        Bladder:                Appears normal  Thoracic:              Appears normal         Spine:                  Previously seen  Heart:                 Previously seen        Upper Extremities:      Previously seen  RVOT:                  Previously seen        Lower Extremities:      Previously seen  LVOT:                  Previously seen  Other:  Fetus  appears to be a female. Heels and Right 5th digit previously          visualized. Technically difficult due to maternal habitus and fetal          position. ---------------------------------------------------------------------- Cervix Uterus Adnexa  Cervix  Not visualized (advanced GA >29wks) ---------------------------------------------------------------------- Myomas   Site                     L(cm)      W(cm)      D(cm)      Location   Rt lat                   11.5       7          9.8        Subserosal  ----------------------------------------------------------------------   Blood Flow                 RI        PI       Comments  ---------------------------------------------------------------------- Impression  Mochorionic diamniotic twin gestation at 34+[redacted] weeks  gestation, here for BPP and growth scan  Twin A:  Maternal left, Cephalic, Anterior placenta  Normal interval anatomy  Normal amniotic fluid volume with MVP 5.6cm  Ed Blalock is normal in the 41st percentile  BPP 8/8  Twin B:  Maternal right, transverse, Anterior placenta  Interval anatomy normal  Normal amniotic fluid volume with MVP 8cm  Normal growth in the 48th percentile  BPP 8/8  No evidence of TTTS either twin  Discordance is 4% ---------------------------------------------------------------------- Recommendations  Continue antenatal testing and [place] as ordered ----------------------------------------------------------------------               Vance Peper, BS, RDMS, RVT Electronically Signed Corrected Final Report  09/22/2016 12:55 pm ----------------------------------------------------------------------  [place] Mfm Fetal Bpp Wo Nst Addl Gestation  Result Date: 09/16/2016 ----------------------------------------------------------------------  OBSTETRICS REPORT                      (Signed Final 09/16/2016 01:08 pm) ---------------------------------------------------------------------- Patient Info  ID #:       [id number]                          D.O.B.:   1990-05-27 (26 yrs)  Name:       Danielle Cisneros                  Visit  Date:  09/16/2016 12:23 pm ---------------------------------------------------------------------- Performed By  Performed By:     Jeanene Erb BS,      Secondary Phy.:   [facility] for                                                             [employer]                                                             [phone])  Attending:        Renella Cunas MD       Address:          [place]                                                             [place]  Referred By:      Ernst Bowler A             Location:  [facility]  Ref. Address:     [place]                    [place]                    [place] ---------------------------------------------------------------------- Orders   #  Description                                 Code   1  [place] MFM FETAL BPP WO NON STRESS              76819.01   2  [place] MFM FETAL BPP WO NST ADDL                60454.0      GESTATION  ----------------------------------------------------------------------   #  Ordered By               Order #        Accession #    Episode #   1  [place]           [id number]      [id number]     [id number]   2  [place]           [id number]      [id number]     [id number]  ---------------------------------------------------------------------- Indications   [redacted] weeks gestation of pregnancy                Z3A.33   Twin pregnancy, mono/di, third trimester       Z56.387   Obesity complicating pregnancy, third          O99.212    trimester (BMI 40)   Uterine fibroids affecting pregnancy in third  O34.13, D25.9   trimester, antepartum   Non-reactive NST                               O28.9  ---------------------------------------------------------------------- OB History  Blood Type:            Height:  5'2"   Weight (lb):  224      BMI:   40.97  Gravidity:    2         Term:   0        Prem:   0        SAB:   1  TOP:          0       Ectopic:  0        Living: 0 ---------------------------------------------------------------------- Fetal Evaluation (Fetus A)  Num Of Fetuses:     2  Fetal Heart         149  Rate(bpm):  Cardiac Activity:   Observed  Fetal Lie:          Maternal left side  Presentation:       Cephalic  Placenta:           Anterior, above cervical os  P. Cord Insertion:  Previously Visualized  Membrane Desc:      Dividing Membrane seen - Monochorionic  Amniotic Fluid  AFI FV:      Subjectively within normal limits                              Largest Pocket(cm)                              5.5 ---------------------------------------------------------------------- Biophysical Evaluation (Fetus A)  Amniotic F.V:   Pocket => 2 cm two         F. Tone:        Observed                  planes  F. Movement:    Observed                   Score:          8/8  F. Breathing:   Observed ---------------------------------------------------------------------- Gestational Age (Fetus A)  LMP:           33w 1d       Date:   01/28/16                 EDD:   11/03/16  Clinical EDD:  33w 1d                                        EDD:   11/03/16  Best:          33w 1d    Det. By:   LMP  (01/28/16)          EDD:   11/03/16 ---------------------------------------------------------------------- Fetal Evaluation (Fetus B)  Num Of Fetuses:     2  Fetal Heart         129  Rate(bpm):  Cardiac Activity:   Observed  Fetal Lie:          Maternal right side  Presentation:       Breech  Placenta:           Anterior, above cervical os  P. Cord Insertion:   Previously Visualized  Membrane Desc:      Dividing Membrane seen - Monochorionic  Amniotic Fluid  AFI FV:      Subjectively within normal limits                              Largest Pocket(cm)                              6.7 ---------------------------------------------------------------------- Biophysical Evaluation (Fetus B)  Amniotic F.V:   Pocket => 2 cm two         F. Tone:        Observed                  planes  F. Movement:    Observed                   Score:          8/8  F. Breathing:   Observed ---------------------------------------------------------------------- Gestational Age (Fetus B)  LMP:           33w 1d       Date:   01/28/16  EDD:   11/03/16  Clinical EDD:  33w 1d                                        EDD:   11/03/16  Best:          33w 1d    Det. By:   LMP  (01/28/16)          EDD:   11/03/16 ---------------------------------------------------------------------- Impression  Monochorionic/diamniotic twin pregnancy at 19+6 weeks  Cephalic/breech presentation  Normal amniotic fluid volume x 2  BPPs 8/8 x 2 ---------------------------------------------------------------------- Recommendations  Continue twice weekly NSTs with weekly AFIs or weekly BPPs  Growth [place] on 06/20 ----------------------------------------------------------------------                 Renella Cunas, MD Electronically Signed Final Report   09/16/2016 01:08 pm ----------------------------------------------------------------------  [place] Mfm Fetal Bpp Wo Nst Addl Gestation  Result Date: 09/14/2016 ----------------------------------------------------------------------  OBSTETRICS REPORT                      (Signed Final 09/14/2016 02:09 pm) ---------------------------------------------------------------------- Patient Info  ID #:       [id number]                         D.O.B.:   27-Oct-1990 (26 yrs)  Name:       Danielle Cisneros                  Visit Date:  09/14/2016 10:44 am  ---------------------------------------------------------------------- Performed By  Performed By:     Hubert Azure          Secondary Phy.:   [facility] for                                                             [facility]                                                             [place])  Attending:        Renella Cunas MD       Address:  [place]                                                             [place]  Referred By:      Charna Archer             Location:         [facility]  Ref. Address:     [place]                    [place]                    [place] ---------------------------------------------------------------------- Orders   #  Description                                 Code   1  [place] MFM FETAL BPP WO NON STRESS              76819.01   2  [place] MFM FETAL BPP WO NST ADDL                89211.9      GESTATION  ----------------------------------------------------------------------   #  Ordered By               Order #        Accession #    Episode #   1  [place]           [id number]      [id number]     [id number]   2  [place]           [id number]      [id number]     [id number]  ---------------------------------------------------------------------- Indications   [redacted] weeks gestation of pregnancy                Z3A.17   Twin pregnancy, mono/di, third  trimester       V42.595   Obesity complicating pregnancy, third          O99.212   trimester (BMI 40)   Uterine fibroids affecting pregnancy in third  O34.13, D25.9   trimester, antepartum   ---------------------------------------------------------------------- OB History  Blood Type:            Height:  5'2"   Weight (lb):  224      BMI:   40.97  Gravidity:    2         Term:   0        Prem:   0        SAB:   1  TOP:          0       Ectopic:  0        Living: 0 ---------------------------------------------------------------------- Fetal Evaluation (Fetus A)  Num Of Fetuses:     2  Fetal Heart         132  Rate(bpm):  Cardiac Activity:   Observed  Fetal Lie:          Maternal left side  Presentation:       Cephalic  Placenta:           Anterior, above cervical os  P. Cord Insertion:  Previously Visualized  Membrane Desc:      Dividing Membrane seen - Monochorionic  Amniotic Fluid  AFI FV:      Subjectively within normal limits                              Largest Pocket(cm)                              6.48 ---------------------------------------------------------------------- Biophysical Evaluation (Fetus A)  Amniotic F.V:   Within normal limits       F. Tone:        Observed  F. Movement:    Observed                   Score:          8/8  F. Breathing:   Observed ---------------------------------------------------------------------- Gestational Age (Fetus A)  LMP:           32w 6d       Date:   01/28/16                 EDD:   11/03/16  Clinical EDD:  32w 6d                                        EDD:   11/03/16  Best:          32w 6d    Det. By:   LMP  (01/28/16)          EDD:   11/03/16 ---------------------------------------------------------------------- Fetal Evaluation (Fetus B)  Num Of Fetuses:     2  Fetal Heart         125  Rate(bpm):  Cardiac Activity:   Observed  Fetal Lie:          Maternal right side  Presentation:       Breech  Placenta:           Anterior, above cervical os  P. Cord Insertion:  Previously Visualized  Membrane Desc:      Dividing Membrane seen - Monochorionic  Amniotic Fluid  AFI FV:      Subjectively within normal limits                              Largest Pocket(cm)                               6.97 ---------------------------------------------------------------------- Biophysical Evaluation (Fetus B)  Amniotic F.V:   Within normal limits       F. Tone:        Observed  F. Movement:    Observed                   Score:          8/8  F. Breathing:   Observed ---------------------------------------------------------------------- Gestational Age (Fetus B)  LMP:           32w 6d       Date:   01/28/16                 EDD:   11/03/16  Clinical EDD:  32w 6d                                        EDD:   11/03/16  Best:          32w 6d    Det. By:   LMP  (01/28/16)          EDD:   11/03/16 ---------------------------------------------------------------------- Impression  Monochorionic/diamniotic twin pregnancy at 96+2 weeks  Cephalic/breech presentation  Normal amniotic fluid volume x 2  BPPs 8/8 x 2 ---------------------------------------------------------------------- Recommendations  Continue antenatal testing  Growth USs on 06/20 ----------------------------------------------------------------------                 Renella Cunas, MD Electronically Signed Final Report   09/14/2016 02:09 pm ----------------------------------------------------------------------  [place] Mfm Fetal Bpp Wo Nst Addl Gestation  Result Date: 09/08/2016 ----------------------------------------------------------------------  OBSTETRICS REPORT                      (Signed Final 09/08/2016 04:04 pm) ---------------------------------------------------------------------- Patient Info  ID #:       [id number]                         D.O.B.:   [birth year] (26 yrs)  Name:       Danielle Cisneros                  Visit Date:  09/08/2016 03:24 pm ---------------------------------------------------------------------- Performed By  Performed By:     Rodrigo Ran BS      Secondary Phy.:   [facility]                                                             [facility]                                                             [employer]/support/administrative officer)  Attending:        Griffin Dakin MD         Address:          [address]                                                             [address] [place]                                                             [place]  Referred By:      Charna Archer             Location:         [facility] [place]  Ref. Address:     [place]                    [place]                    [place] ---------------------------------------------------------------------- Orders   #  Description                                 Code   1  [place] MFM OB LIMITED  44010.27   2  [place] MFM FETAL BPP WO NON STRESS              76819.01   3  [place] MFM FETAL BPP WO NST ADDL                25366.4      GESTATION  ----------------------------------------------------------------------   #  Ordered By               Order #        Accession #    Episode #   1  [place]            [id number]      [id number]     [id number]   2  [place]           [id number]      [id number]     [id number]   3  [place]           [id number]      [id number]     [id number]  ---------------------------------------------------------------------- Indications   [redacted] weeks gestation of pregnancy                Z3A.62   Twin pregnancy, mono/di, third trimester       O27.035   Obesity complicating pregnancy, third          O99.212   trimester (BMI 40)   Uterine fibroids affecting pregnancy in third  O34.13, D25.9   trimester, antepartum  ---------------------------------------------------------------------- OB History  Blood Type:            Height:  5'2"   Weight (lb):  224      BMI:   40.97   Gravidity:    2         Term:   0        Prem:   0        SAB:   1  TOP:          0       Ectopic:  0        Living: 0 ---------------------------------------------------------------------- Fetal Evaluation (Fetus A)  Num Of Fetuses:     2  Fetal Heart         155  Rate(bpm):  Cardiac Activity:   Observed  Fetal Lie:          Maternal left side  Presentation:       Cephalic  Placenta:           Anterior, above cervical os  P. Cord Insertion:  Visualized  Membrane Desc:      Dividing Membrane seen - Monochorionic  Amniotic Fluid  AFI FV:      Subjectively within normal limits                              Largest Pocket(cm)                              3.58 ---------------------------------------------------------------------- Biophysical Evaluation (Fetus A)  Amniotic F.V:   Within normal limits       F. Tone:        Observed  F. Movement:    Observed                   Score:          8/8  F. Breathing:   Observed ---------------------------------------------------------------------- Gestational Age (Fetus A)  LMP:           32w 0d       Date:   01/28/16                 EDD:   11/03/16  Clinical EDD:  Milderd Meager 0d                                        EDD:   11/03/16  Best:          32w 0d    Det. By:   LMP  (01/28/16)          EDD:   11/03/16 ---------------------------------------------------------------------- Fetal Evaluation (Fetus B)  Num Of Fetuses:     2  Fetal Heart         123  Rate(bpm):  Cardiac Activity:   Observed  Fetal Lie:          Maternal right side  Presentation:       Breech  Placenta:           Anterior, above cervical os  P. Cord Insertion:  Marginal insertion  Membrane Desc:      Dividing Membrane seen - Monochorionic  Amniotic Fluid  AFI FV:      Subjectively within normal limits                              Largest Pocket(cm)                              6.07 ---------------------------------------------------------------------- Biophysical Evaluation (Fetus B)  Amniotic F.V:   Within normal  limits       F. Tone:        Observed  F. Movement:    Observed                   Score:          8/8  F. Breathing:   Observed ---------------------------------------------------------------------- Gestational Age (Fetus B)  LMP:           32w 0d       Date:   01/28/16                 EDD:   11/03/16  Clinical EDD:  Milderd Meager 0d                                        EDD:   11/03/16  Best:          32w 0d    Det. By:   LMP  (01/28/16)          EDD:   11/03/16 ---------------------------------------------------------------------- Impression  Mochorionic diamniotic twin gestation at 32+[redacted] weeks  gestation, here for BPP and TTTS check  Twin A:  Maternal left, Cephalic, Anterior placenta  Fetal bladder visualized  Normal amniotic fluid volume with MVP 3.6cm  BPP 8/8  Twin B:  Maternal right, Breech, Anterior placenta  Fetal bladder visualized  Normal amniotic fluid volume with MVP 6.1cm  BPP 8/8  No evidence of TTTS either twin ---------------------------------------------------------------------- Recommendations  Repeat evaluation in 2 weeks ----------------------------------------------------------------------  Griffin Dakin, MD Electronically Signed Final Report   09/08/2016 04:04 pm ----------------------------------------------------------------------   MAU Course: BSUS:  Baby A is vertex and on maternal left;  Baby B is breech on maternal right;  Anterior placenta. Difficulty due to body habitus.  SVE: 1/60-70%/-3, vertex, amniotic sac intact UA: Neg, sent for culture PO dilaudid 2mg  - helped to relieve pain to tolerable level Formal US - no sign of degenerating fibroid, RLQ fibroid 14cmx9cmx7cm   I personally reviewed the patient's NST today, found to be REACTIVE for baby A and B.    MDM: Plan of care reviewed with patient, including labs and tests ordered and medical treatment. Discussed preterm labor symptoms and when to return. Pain medication for fibroid/abdominal pain. No sign of  bleeding/abruption, or labor.    Assessment: 1. Leiomyoma of uterus affecting pregnancy in third trimester   2. Leiomyoma   3. [redacted] weeks gestation of pregnancy   4. Monochorionic diamniotic twin gestation in third trimester   5. Abdominal pain during pregnancy in third trimester   6. Abdominal pain affecting pregnancy     Plan: Discharge home in stable condition.  Preterm labor precautions and fetal kick counts PO Dilaudid Rx   Follow-up Natalia. Schedule an appointment as soon as possible for a visit in 1 week(s).   Specialty:  Obstetrics and Gynecology Why:  Follow up for routine OB visit Contact information: [address] [place] [phone]          Allergies as of 09/27/2016   No Known Allergies     Medication List    STOP taking these medications   butalbital-acetaminophen-caffeine 50-325-40 MG tablet Commonly known as:  FIORICET, ESGIC   cyclobenzaprine 10 MG tablet Commonly known as:  FLEXERIL     TAKE these medications   aspirin EC 81 MG tablet Take 81 mg by mouth daily.   docusate sodium 100 MG capsule Commonly known as:  COLACE Take 100 mg by mouth 2 (two) times daily.   FERRALET 90 90-1 MG Tabs Take 1 tablet by mouth daily.   HYDROmorphone 2 MG tablet Commonly known as:  DILAUDID Take 1 tablet (2 mg total) by mouth every 12 (twelve) hours as needed for severe pain.   prenatal vitamin w/FE, FA 29-1 MG Chew chewable tablet Chew 1 tablet by mouth daily at 12 noon.       Katherine Basset, DO [facility], [facility] 09/27/2016  10:25 AM

## 2016-09-27 NOTE — Progress Notes (Addendum)
Assumed care. EFM adjusted. Trying to find 2nd baby with EFM.  0955: Provider at bs. BS U/S being done to obtain both FHT.   1138: back from U/S. To bathroom at the moment.   1144: EFM applied.   yellow is Baby A  120 Blue is baby B 125  1215: Another nurse trying to find the other baby on monitor.   1255: decel noted. Notified MD. No new orders.   1410: up to bathroom. Dr. Vanetta Shawl notified.   1415: Dr Vanetta Shawl ok to d/c EFM.   1423: provider at bs for recheck cervix. SVE   Discharge instructions given with pt understanding. Pt left unit via ambulatory with family

## 2016-09-27 NOTE — Discharge Instructions (Signed)
Abdominal Pain During Pregnancy Belly (abdominal) pain is common during pregnancy. Most of the time, it is not a serious problem. Other times, it can be a sign that something is wrong with the pregnancy. Always tell your doctor if you have belly pain. Follow these instructions at home: Monitor your belly pain for any changes. The following actions may help you feel better:  Do not have sex (intercourse) or put anything in your vagina until you feel better.  Rest until your pain stops.  Drink clear fluids if you feel sick to your stomach (nauseous). Do not eat solid food until you feel better.  Only take medicine as told by your doctor.  Keep all doctor visits as told.  Get help right away if:  You are bleeding, leaking fluid, or pieces of tissue come out of your vagina.  You have more pain or cramping.  You keep throwing up (vomiting).  You have pain when you pee (urinate) or have blood in your pee.  You have a fever.  You do not feel your baby moving as much.  You feel very weak or feel like passing out.  You have trouble breathing, with or without belly pain.  You have a very bad headache and belly pain.  You have fluid leaking from your vagina and belly pain.  You keep having watery poop (diarrhea).  Your belly pain does not go away after resting, or the pain gets worse. This information is not intended to replace advice given to you by your health care provider. Make sure you discuss any questions you have with your health care provider. Document Released: 03/10/2009 Document Revised: 10/29/2015 Document Reviewed: 10/19/2012 Elsevier Interactive Patient Education  2018 [employer].    Uterine Fibroids Uterine fibroids are tissue masses (tumors). They are also called leiomyomas. They can develop inside of a womans womb (uterus). They can grow very large. Fibroids are not cancerous (benign). Most fibroids do not require medical treatment. Follow these instructions  at home:  Keep all follow-up visits as told by your doctor. This is important.  Take medicines only as told by your doctor. ? If you were prescribed a hormone treatment, take the hormone medicines exactly as told. ? Do not take aspirin. It can cause bleeding.  Ask your doctor about taking iron pills and increasing the amount of dark green, leafy vegetables in your diet. These actions can help to boost your blood iron levels.  Pay close attention to your period. Tell your doctor about any changes, such as: ? Increased blood flow. This may require you to use more pads or tampons than usual per month. ? A change in the number of days that your period lasts per month. ? A change in symptoms that come with your period, such as back pain or cramping in your belly area (abdomen). Contact a doctor if:  You have pain in your back or the area between your hip bones (pelvic area) that is not controlled by medicines.  You have pain in your abdomen that is not controlled with medicines.  You have an increase in bleeding between and during periods.  You soak tampons or pads in a half hour or less.  You feel lightheaded.  You feel extra tired.  You feel weak. Get help right away if:  You pass out (faint).  You have a sudden increase in pelvic pain. This information is not intended to replace advice given to you by your health care provider. Make sure you  discuss any questions you have with your health care provider. Document Released: 04/24/2010 Document Revised: 11/21/2015 Document Reviewed: 09/18/2013 Elsevier Interactive Patient Education  [employer].

## 2016-09-27 NOTE — MAU Note (Signed)
Pt presents to MAU with complaints of extreme lower abdominal pain and hip pain for two days. PT states that she does have a pregnancy belt but states she is unable to wear it long due to it being too tight. PT denies any VB or LOF

## 2016-09-28 ENCOUNTER — Inpatient Hospital Stay (HOSPITAL_COMMUNITY): Payer: BLUE CROSS/BLUE SHIELD | Admitting: Anesthesiology

## 2016-09-28 ENCOUNTER — Encounter (HOSPITAL_COMMUNITY): Payer: Self-pay | Admitting: *Deleted

## 2016-09-28 ENCOUNTER — Inpatient Hospital Stay (HOSPITAL_COMMUNITY): Payer: BLUE CROSS/BLUE SHIELD

## 2016-09-28 ENCOUNTER — Inpatient Hospital Stay (HOSPITAL_COMMUNITY)
Admission: AD | Admit: 2016-09-28 | Discharge: 2016-09-30 | DRG: 774 | Disposition: A | Discharge status: Home / Self Care | Payer: BLUE CROSS/BLUE SHIELD | Source: Ambulatory Visit | Attending: Family Medicine | Admitting: Family Medicine

## 2016-09-28 ENCOUNTER — Encounter: Payer: Self-pay | Admitting: Obstetrics and Gynecology

## 2016-09-28 DIAGNOSIS — I959 Hypotension, unspecified: Secondary | ICD-10-CM | POA: Diagnosis not present

## 2016-09-28 DIAGNOSIS — O42919 Preterm premature rupture of membranes, unspecified as to length of time between rupture and onset of labor, unspecified trimester: Secondary | ICD-10-CM | POA: Diagnosis present

## 2016-09-28 DIAGNOSIS — O42013 Preterm premature rupture of membranes, onset of labor within 24 hours of rupture, third trimester: Secondary | ICD-10-CM | POA: Diagnosis present

## 2016-09-28 DIAGNOSIS — O26893 Other specified pregnancy related conditions, third trimester: Secondary | ICD-10-CM | POA: Diagnosis present

## 2016-09-28 DIAGNOSIS — O99214 Obesity complicating childbirth: Secondary | ICD-10-CM | POA: Diagnosis present

## 2016-09-28 DIAGNOSIS — O30033 Twin pregnancy, monochorionic/diamniotic, third trimester: Secondary | ICD-10-CM | POA: Diagnosis present

## 2016-09-28 DIAGNOSIS — D649 Anemia, unspecified: Secondary | ICD-10-CM | POA: Diagnosis not present

## 2016-09-28 DIAGNOSIS — Z6841 Body Mass Index (BMI) 40.0 and over, adult: Secondary | ICD-10-CM

## 2016-09-28 DIAGNOSIS — Z6791 Unspecified blood type, Rh negative: Secondary | ICD-10-CM | POA: Diagnosis not present

## 2016-09-28 DIAGNOSIS — O321XX2 Maternal care for breech presentation, fetus 2: Secondary | ICD-10-CM | POA: Diagnosis present

## 2016-09-28 DIAGNOSIS — D259 Leiomyoma of uterus, unspecified: Secondary | ICD-10-CM

## 2016-09-28 DIAGNOSIS — O9081 Anemia of the puerperium: Secondary | ICD-10-CM | POA: Diagnosis not present

## 2016-09-28 DIAGNOSIS — Z7982 Long term (current) use of aspirin: Secondary | ICD-10-CM | POA: Diagnosis not present

## 2016-09-28 DIAGNOSIS — O99213 Obesity complicating pregnancy, third trimester: Secondary | ICD-10-CM

## 2016-09-28 DIAGNOSIS — Z3A34 34 weeks gestation of pregnancy: Secondary | ICD-10-CM

## 2016-09-28 DIAGNOSIS — O3413 Maternal care for benign tumor of corpus uteri, third trimester: Secondary | ICD-10-CM

## 2016-09-28 LAB — CBC
HCT: 34.8 % — ABNORMAL LOW (ref 36.0–46.0)
HEMATOCRIT: 26.9 % — AB (ref 36.0–46.0)
HEMOGLOBIN: 8.9 g/dL — AB (ref 12.0–15.0)
Hemoglobin: 11.5 g/dL — ABNORMAL LOW (ref 12.0–15.0)
MCH: 25.8 pg — ABNORMAL LOW (ref 26.0–34.0)
MCH: 26.1 pg (ref 26.0–34.0)
MCHC: 33 g/dL (ref 30.0–36.0)
MCHC: 33.1 g/dL (ref 30.0–36.0)
MCV: 78.2 fL (ref 78.0–100.0)
MCV: 78.9 fL (ref 78.0–100.0)
Platelets: 268 10*3/uL (ref 150–400)
Platelets: 250 10*3/uL (ref 150–400)
RBC: 4.45 MIL/uL (ref 3.87–5.11)
RBC: 3.41 MIL/uL — ABNORMAL LOW (ref 3.87–5.11)
RDW: 16.6 % — AB (ref 11.5–15.5)
RDW: 16.6 % — ABNORMAL HIGH (ref 11.5–15.5)
WBC: 15.8 10*3/uL — AB (ref 4.0–10.5)
WBC: 16.8 10*3/uL — AB (ref 4.0–10.5)

## 2016-09-28 LAB — RPR: RPR Ser Ql: NONREACTIVE

## 2016-09-28 LAB — CULTURE, OB URINE

## 2016-09-28 LAB — POCT FERN TEST: POCT FERN TEST: POSITIVE

## 2016-09-28 MED ORDER — OXYCODONE-ACETAMINOPHEN 5-325 MG PO TABS: 2.0000 | ORAL_TABLET | ORAL | Status: DC | PRN

## 2016-09-28 MED ORDER — FLEET ENEMA 7-19 GM/118ML RE ENEM: 1.0000 | ENEMA | RECTAL | Status: DC | PRN

## 2016-09-28 MED ORDER — OXYCODONE-ACETAMINOPHEN 5-325 MG PO TABS: 1.0000 | ORAL_TABLET | ORAL | Status: DC | PRN

## 2016-09-28 MED ORDER — DIPHENHYDRAMINE HCL 50 MG/ML IJ SOLN: 12.5000 mg | INTRAMUSCULAR | Status: DC | PRN

## 2016-09-28 MED ORDER — OXYTOCIN 40 UNITS IN LACTATED RINGERS INFUSION - SIMPLE MED
2.5000 [IU]/h | INTRAVENOUS | Status: DC
  Administered 2016-09-28: 2.5 [IU]/h via INTRAVENOUS
  Filled 2016-09-28: qty 1000

## 2016-09-28 MED ORDER — LACTATED RINGERS IV BOLUS (SEPSIS)
500.0000 mL | Freq: Once | INTRAVENOUS | Status: AC
  Administered 2016-09-29: 500 mL via INTRAVENOUS

## 2016-09-28 MED ORDER — LACTATED RINGERS IV SOLN
500.0000 mL | Freq: Once | INTRAVENOUS | Status: AC
  Administered 2016-09-28: 500 mL via INTRAVENOUS

## 2016-09-28 MED ORDER — METHYLERGONOVINE MALEATE 0.2 MG/ML IJ SOLN
0.2000 mg | Freq: Once | INTRAMUSCULAR | Status: AC
  Administered 2016-09-28: 0.2 mg via INTRAMUSCULAR

## 2016-09-28 MED ORDER — ONDANSETRON HCL 4 MG/2ML IJ SOLN: 4.0000 mg | Freq: Four times a day (QID) | INTRAMUSCULAR | Status: DC | PRN

## 2016-09-28 MED ORDER — FENTANYL 2.5 MCG/ML BUPIVACAINE 1/10 % EPIDURAL INFUSION (WH - ANES)
14.0000 mL/h | INTRAMUSCULAR | Status: DC | PRN
  Administered 2016-09-28 (×2): 14 mL/h via EPIDURAL
  Filled 2016-09-28 (×2): qty 100

## 2016-09-28 MED ORDER — METHYLERGONOVINE MALEATE 0.2 MG/ML IJ SOLN
INTRAMUSCULAR | Status: AC
  Filled 2016-09-28: qty 1

## 2016-09-28 MED ORDER — BETAMETHASONE SOD PHOS & ACET 6 (3-3) MG/ML IJ SUSP
12.0000 mg | INTRAMUSCULAR | Status: DC
  Administered 2016-09-28: 12 mg via INTRAMUSCULAR
  Filled 2016-09-28 (×2): qty 2

## 2016-09-28 MED ORDER — LIDOCAINE HCL (PF) 1 % IJ SOLN
30.0000 mL | INTRAMUSCULAR | Status: AC | PRN
  Administered 2016-09-28: 30 mL via SUBCUTANEOUS
  Filled 2016-09-28: qty 30

## 2016-09-28 MED ORDER — OXYTOCIN 40 UNITS IN LACTATED RINGERS INFUSION - SIMPLE MED
1.0000 m[IU]/min | INTRAVENOUS | Status: DC
  Administered 2016-09-28: 2 m[IU]/min via INTRAVENOUS

## 2016-09-28 MED ORDER — LACTATED RINGERS IV SOLN
INTRAVENOUS | Status: DC
  Administered 2016-09-28 (×3): via INTRAVENOUS

## 2016-09-28 MED ORDER — PHENYLEPHRINE 40 MCG/ML (10ML) SYRINGE FOR IV PUSH (FOR BLOOD PRESSURE SUPPORT)
80.0000 ug | PREFILLED_SYRINGE | INTRAVENOUS | Status: DC | PRN
Start: 2016-09-28 — End: 2016-09-29
  Filled 2016-09-28: qty 10
  Filled 2016-09-28: qty 5

## 2016-09-28 MED ORDER — PHENYLEPHRINE 40 MCG/ML (10ML) SYRINGE FOR IV PUSH (FOR BLOOD PRESSURE SUPPORT)
80.0000 ug | PREFILLED_SYRINGE | INTRAVENOUS | Status: DC | PRN
  Filled 2016-09-28: qty 5

## 2016-09-28 MED ORDER — EPHEDRINE 5 MG/ML INJ
10.0000 mg | INTRAVENOUS | Status: DC | PRN
  Filled 2016-09-28: qty 2

## 2016-09-28 MED ORDER — FENTANYL CITRATE (PF) 100 MCG/2ML IJ SOLN: 100.0000 ug | INTRAMUSCULAR | Status: DC | PRN

## 2016-09-28 MED ORDER — ACETAMINOPHEN 325 MG PO TABS: 650.0000 mg | ORAL_TABLET | ORAL | Status: DC | PRN

## 2016-09-28 MED ORDER — SOD CITRATE-CITRIC ACID 500-334 MG/5ML PO SOLN
30.0000 mL | ORAL | Status: DC | PRN
  Filled 2016-09-28: qty 15

## 2016-09-28 MED ORDER — DEXTROSE 5 % IV SOLN
5.0000 10*6.[IU] | Freq: Once | INTRAVENOUS | Status: AC
  Administered 2016-09-28: 5 10*6.[IU] via INTRAVENOUS
  Filled 2016-09-28: qty 5

## 2016-09-28 MED ORDER — PENICILLIN G POT IN DEXTROSE 60000 UNIT/ML IV SOLN
3.0000 10*6.[IU] | INTRAVENOUS | Status: DC
  Administered 2016-09-28 (×3): 3 10*6.[IU] via INTRAVENOUS
  Filled 2016-09-28 (×7): qty 50

## 2016-09-28 MED ORDER — OXYTOCIN BOLUS FROM INFUSION
500.0000 mL | Freq: Once | INTRAVENOUS | Status: AC
  Administered 2016-09-28: 500 mL via INTRAVENOUS

## 2016-09-28 MED ORDER — LIDOCAINE HCL (PF) 1 % IJ SOLN
INTRAMUSCULAR | Status: DC | PRN
  Administered 2016-09-28: 13 mL via EPIDURAL

## 2016-09-28 MED ORDER — LACTATED RINGERS IV SOLN: 500.0000 mL | INTRAVENOUS | Status: DC | PRN

## 2016-09-28 MED ORDER — TERBUTALINE SULFATE 1 MG/ML IJ SOLN
0.2500 mg | Freq: Once | INTRAMUSCULAR | Status: DC | PRN
  Filled 2016-09-28: qty 1

## 2016-09-28 NOTE — Progress Notes (Signed)
Labor Progress Note  Danielle Cisneros is a 26 y.o. G2P0010 at [redacted]w[redacted]d  admitted for rupture of membranes at 0530 this AM with clear fluid.  FHT A: 120 bmp, mod var, accels, no decels FHT B: 120 bmp, mod var, accels, no decels UC:  Irregular  SVE:   Dilation: 8.5 Station: +1 Exam by:: Dr Burr Medico SROM 0530 clear  Labor: Progressing normally Fetal Wellbeing:  Category I Pain Control:  Epidural Anticipated MOD:  NSVD  Expectant management  Everrett Coombe, MD PGY-1 [facility]

## 2016-09-28 NOTE — Progress Notes (Signed)
S: Patient seen & examined for progress of labor. Patient comfortable with epidural.   Dilation: Lip/rim Station: +1, +2 Presentation: Vertex Exam by:: Sharyn Lull, RN    FHT A: 130 bpm, mod var, +accels, no decels FHT B: 130 bpm, mod var, +accels, no decels TOCO: occasional - spaced to 78m on last contraction  A/P: Labor: Contractions spacing out, still with anterior lip that is reducible when pushing. Start pitocin, allow patient to labor down . Pain: epidural FWB: cat 1  Continue expectant management Anticipate SVD  Everrett Coombe, MD PGY-1 [facility]

## 2016-09-28 NOTE — MAU Note (Addendum)
PT  SAYS  SROM   AT 0530.    UC   STRONG  SINCE 0600.     VE IN OFFICE  -      1 CM.      HAS  TWINS

## 2016-09-28 NOTE — Anesthesia Preprocedure Evaluation (Signed)
Anesthesia Evaluation  Patient identified by MRN, date of birth, ID band Patient awake    Reviewed: Allergy & Precautions, NPO status , Patient's Chart, lab work & pertinent test results  Airway Mallampati: II  TM Distance: >3 FB Neck ROM: Full    Dental no notable dental hx.    Pulmonary neg pulmonary ROS,    Pulmonary exam normal breath sounds clear to auscultation       Cardiovascular negative cardio ROS Normal cardiovascular exam Rhythm:Regular Rate:Normal     Neuro/Psych negative neurological ROS  negative psych ROS   GI/Hepatic negative GI ROS, Neg liver ROS,   Endo/Other  negative endocrine ROSMorbid obesity  Renal/GU negative Renal ROS  negative genitourinary   Musculoskeletal negative musculoskeletal ROS (+)   Abdominal   Peds negative pediatric ROS (+)  Hematology negative hematology ROS (+)   Anesthesia Other Findings   Reproductive/Obstetrics negative OB ROS (+) Pregnancy                             Anesthesia Physical Anesthesia Plan  ASA: III  Anesthesia Plan: Epidural   Post-op Pain Management:    Induction:   PONV Risk Score and Plan:   Airway Management Planned:   Additional Equipment:   Intra-op Plan:   Post-operative Plan:   Informed Consent:   Plan Discussed with:   Anesthesia Plan Comments:         Anesthesia Quick Evaluation

## 2016-09-28 NOTE — Anesthesia Procedure Notes (Signed)
Epidural Patient location during procedure: OB Start time: 09/28/2016 9:25 AM End time: 09/28/2016 9:38 AM  Staffing Anesthesiologist: Candida Peeling RAY Performed: anesthesiologist   Preanesthetic Checklist Completed: patient identified, site marked, surgical consent, pre-op evaluation, timeout performed, IV checked, risks and benefits discussed and monitors and equipment checked  Epidural Patient position: sitting Prep: DuraPrep Patient monitoring: heart rate, cardiac monitor, continuous pulse ox and blood pressure Approach: midline Location: L2-L3 Injection technique: LOR saline  Needle:  Needle type: Tuohy  Needle gauge: 17 G Needle length: 9 cm Needle insertion depth: 6 cm Catheter type: closed end flexible Catheter size: 20 Guage Catheter at skin depth: 10 cm Test dose: negative  Assessment Events: blood not aspirated, injection not painful, no injection resistance, negative IV test and no paresthesia  Additional Notes Reason for block:procedure for pain

## 2016-09-28 NOTE — H&P (Signed)
LABOR AND DELIVERY ADMISSION HISTORY AND PHYSICAL NOTE  Danielle Cisneros is a 26 y.o. female G2P0010 with IUP at [redacted]w[redacted]d by LMP consistent with early ultrasound presenting for SROM @ 0530 with clear fluid. She reports +FM, + contractions, No LOF, no VB, no blurry vision, headaches or peripheral edema, and RUQ pain.  She plans on bottle and breast feeding.   Prenatal History/Complications: Patient received RhoGam at 28 weeks (08/16/2016).  Past Medical History: Past Medical History:  Diagnosis Date  . Medical history non-contributory     Past Surgical History: Past Surgical History:  Procedure Laterality Date  . NO PAST SURGERIES      Obstetrical History: OB History    Gravida Para Term Preterm AB Living   2 0 0   1 0   SAB TAB Ectopic Multiple Live Births   1       0      Obstetric Comments   SAB July 2017.       Social History: Social History   Social History  . Marital status: Married    Spouse name: n/a  . Number of children: 0  . Years of education: N/A   Occupational History  . [employer]  . Habilitation Technician    Social History Main Topics  . Smoking status: Never Smoker  . Smokeless tobacco: Never Used  . Alcohol use No  . Drug use: No  . Sexual activity: Yes    Birth control/ protection: None   Other Topics Concern  . None   Social History Narrative   Lives with her father.   Born in [place].  Came to the [place] at age 54.    Family History: History reviewed. No pertinent family history.  Allergies: No Known Allergies  Prescriptions Prior to Admission  Medication Sig Dispense Refill Last Dose  . aspirin EC 81 MG tablet Take 81 mg by mouth daily.   Past Week at Unknown time  . docusate sodium (COLACE) 100 MG capsule Take 100 mg by mouth 2 (two) times daily.   09/26/2016 at Unknown time  . Fe Cbn-Fe Gluc-FA-B12-C-DSS (FERRALET 90) 90-1 MG TABS Take 1 tablet by mouth daily. 30 each 11 09/26/2016 at Unknown time  . HYDROmorphone (DILAUDID)  2 MG tablet Take 1 tablet (2 mg total) by mouth every 12 (twelve) hours as needed for severe pain. 10 tablet 0   . prenatal vitamin w/FE, FA (NATACHEW) 29-1 MG CHEW chewable tablet Chew 1 tablet by mouth daily at 12 noon. 30 tablet 12 09/26/2016 at Unknown time     Review of Systems   All systems reviewed and negative except as stated in HPI  Physical Exam Blood pressure 139/72, pulse 93, temperature 99 F (37.2 C), temperature source Oral, resp. rate 18, height 5\' 3"  (1.6 m), weight 111.6 kg (246 lb), last menstrual period 01/28/2016, SpO2 100 %. General appearance: alert, cooperative and appears stated age   Presentation: cephalic baby A, breech baby B Fetal monitoring Baby A: 120 bmp, mod variability with accels and no decels Fetal monitoring Baby B:120 bmp mod variability with accels and no decels Uterine activity: q6m contractions     Prenatal labs: ABO, Rh: --/--/O NEG (06/26 0715) Antibody: POS (06/26 0715) Rubella: Immune RPR: Non Reactive (05/02 1115)  HBsAg: Negative (01/09 1050)  HIV: Non Reactive (05/02 1115)  GBS:   unknown, PCN given 1 hr Glucola: WNL Genetic screening:  normal Anatomy US: 06-04-16 normal female fetuses mono/di serial [place] for TTTS and growth  Prenatal Transfer Tool  Maternal Diabetes: No Genetic Screening: Normal Maternal Ultrasounds/Referrals: Normal Fetal Ultrasounds or other Referrals:  None Maternal Substance Abuse:  No Significant Maternal Medications:  None Significant Maternal Lab Results: Lab values include: Other:  GBS unknown  Results for orders placed or performed during the hospital encounter of 09/28/16 (from the past 24 hour(s))  Fern Test   Collection Time: 09/28/16  7:09 AM  Result Value Ref Range   POCT Fern Test Positive = ruptured amniotic membanes   CBC   Collection Time: 09/28/16  7:15 AM  Result Value Ref Range   WBC 15.8 (H) 4.0 - 10.5 K/uL   RBC 4.45 3.87 - 5.11 MIL/uL   Hemoglobin 11.5 (L) 12.0 - 15.0 g/dL   HCT  34.8 (L) 36.0 - 46.0 %   MCV 78.2 78.0 - 100.0 fL   MCH 25.8 (L) 26.0 - 34.0 pg   MCHC 33.0 30.0 - 36.0 g/dL   RDW 16.6 (H) 11.5 - 15.5 %   Platelets 268 150 - 400 K/uL  Type and screen [place]   Collection Time: 09/28/16  7:15 AM  Result Value Ref Range   ABO/RH(D) O NEG    Antibody Screen POS    Sample Expiration 10/01/2016    Antibody Identification PENDING    DAT, IgG NEG     Patient Active Problem List   Diagnosis Date Noted  . BMI 40.0-44.9, adult ([place]) 09/28/2016  . Preterm premature rupture of membranes (PPROM) with unknown onset of labor 09/28/2016  . Monochorionic diamniotic twin gestation 06/08/2016  . Uterine fibroid complicating antenatal care, baby not yet delivered in second trimester 06/04/2016  . Supervision of high-risk pregnancy 04/13/2016  . Rh negative state in antepartum period 04/13/2016  . Morbid obesity ([place]) 07/17/2015    Assessment: Danielle Cisneros is a 26 y.o. G2P0010 at [redacted]w[redacted]d here for SOL/SROM  #Labor:progressing normally, planning SVD.  - PCN given - betamethasone given #Pain: Comfortable with Epidural #FWB: Twin A and Twin B with reactive FHT, category I #ID:  GBS unknown, PCN given #MOF: breast and bottle #MOC:declines #Circ:  N/A  Everrett Coombe, MD PGY-1 [facility]  09/28/2016, 9:26 AM    CNM note:   Patient now 6 cm/100/0 station. Comfortable with epidural. At this point, will continue without augmentation. Consider IUPC and pit if no change in 4 hours.  I confirm that I have verified the information documented in the resident's note and that I have also personally reperformed the physical exam and all medical decision making activities.  Maye Hides CNM

## 2016-09-28 NOTE — Consult Note (Signed)
Neonatology Note:   Attendance at Delivery Twin B:    I was asked by Dr. Nehemiah Settle to attend this vaginal delivery of 34 6/7 week twins. The mother is a G2P0010, GBS unknown with good prenatal care.  Pregnancy complicated by SROM of Twin A at 0540 today, breech presentation of twin B and concordant mono/di.  BTMZ x1 with prophylactic abx. Fluid clear.  Footling breech delivery after failed rotation.  Fluid clear. Infant vigorous with good spontaneous cry and tone. 60 sec DCC.  Needed only minimal bulb suctioning and brief blow by oxygen.  Ap 7/9. Lungs clear to ausc in DR. To NICU due to gestation.   Danielle Sabal Katherina Mires, MD

## 2016-09-28 NOTE — Consult Note (Signed)
Neonatology Note:   Attendance at Delivery Twin A:    I was asked by Dr. Nehemiah Settle to attend this vaginal delivery of 34 6/7 week twins. The mother is a G2P0010, GBS unknown with good prenatal care.  Pregnancy complicated by SROM of Twin A at 0540 today, breech presentation of twin B and concordant mono/di.  BTMZ x1 with prophylactic abx. Fluid clear. Infant vigorous with good spontaneous cry and tone.  60 sec DCC.  Needed only minimal bulb suctioning. Ap 9/9. Lungs clear to ausc in DR. Received brief blow by oxygen.  To NICU due to gestation.   Danielle Sabal Katherina Mires, MD

## 2016-09-29 LAB — CBC
HCT: 21.5 % — ABNORMAL LOW (ref 36.0–46.0)
Hemoglobin: 7.3 g/dL — ABNORMAL LOW (ref 12.0–15.0)
MCH: 26.4 pg (ref 26.0–34.0)
MCHC: 34 g/dL (ref 30.0–36.0)
MCV: 77.6 fL — ABNORMAL LOW (ref 78.0–100.0)
PLATELETS: 216 10*3/uL (ref 150–400)
RBC: 2.77 MIL/uL — AB (ref 3.87–5.11)
RDW: 16.2 % — ABNORMAL HIGH (ref 11.5–15.5)
WBC: 23.9 10*3/uL — AB (ref 4.0–10.5)

## 2016-09-29 MED ORDER — SENNOSIDES-DOCUSATE SODIUM 8.6-50 MG PO TABS
2.0000 | ORAL_TABLET | ORAL | Status: DC
  Administered 2016-09-29: 2 via ORAL
  Filled 2016-09-29: qty 2

## 2016-09-29 MED ORDER — PRENATAL MULTIVITAMIN CH
1.0000 | ORAL_TABLET | Freq: Every day | ORAL | Status: DC
  Administered 2016-09-29 – 2016-09-30 (×2): 1 via ORAL
  Filled 2016-09-29 (×2): qty 1

## 2016-09-29 MED ORDER — DIPHENHYDRAMINE HCL 25 MG PO CAPS: 25.0000 mg | ORAL_CAPSULE | Freq: Four times a day (QID) | ORAL | Status: DC | PRN

## 2016-09-29 MED ORDER — FERROUS SULFATE 325 (65 FE) MG PO TABS
325.0000 mg | ORAL_TABLET | Freq: Three times a day (TID) | ORAL | Status: DC
  Administered 2016-09-29 – 2016-09-30 (×5): 325 mg via ORAL
  Filled 2016-09-29 (×5): qty 1

## 2016-09-29 MED ORDER — OXYCODONE-ACETAMINOPHEN 5-325 MG PO TABS
1.0000 | ORAL_TABLET | ORAL | Status: DC | PRN
  Administered 2016-09-30: 1 via ORAL
  Filled 2016-09-29: qty 1

## 2016-09-29 MED ORDER — TETANUS-DIPHTH-ACELL PERTUSSIS 5-2.5-18.5 LF-MCG/0.5 IM SUSP: 0.5000 mL | Freq: Once | INTRAMUSCULAR | Status: DC

## 2016-09-29 MED ORDER — OXYCODONE-ACETAMINOPHEN 5-325 MG PO TABS: 2.0000 | ORAL_TABLET | ORAL | Status: DC | PRN

## 2016-09-29 MED ORDER — ZOLPIDEM TARTRATE 5 MG PO TABS
5.0000 mg | ORAL_TABLET | Freq: Every evening | ORAL | Status: DC | PRN
Start: 2016-09-29 — End: 2016-10-01

## 2016-09-29 MED ORDER — BENZOCAINE-MENTHOL 20-0.5 % EX AERO
1.0000 "application " | INHALATION_SPRAY | CUTANEOUS | Status: DC | PRN
  Filled 2016-09-29: qty 56

## 2016-09-29 MED ORDER — ONDANSETRON HCL 4 MG/2ML IJ SOLN: 4.0000 mg | INTRAMUSCULAR | Status: DC | PRN

## 2016-09-29 MED ORDER — COCONUT OIL OIL
1.0000 "application " | TOPICAL_OIL | Status: DC | PRN
  Filled 2016-09-29: qty 120

## 2016-09-29 MED ORDER — ONDANSETRON HCL 4 MG PO TABS: 4.0000 mg | ORAL_TABLET | ORAL | Status: DC | PRN

## 2016-09-29 MED ORDER — DIBUCAINE 1 % RE OINT: 1.0000 "application " | TOPICAL_OINTMENT | RECTAL | Status: DC | PRN

## 2016-09-29 MED ORDER — IBUPROFEN 600 MG PO TABS
600.0000 mg | ORAL_TABLET | Freq: Four times a day (QID) | ORAL | Status: DC
  Administered 2016-09-29 – 2016-09-30 (×7): 600 mg via ORAL
  Filled 2016-09-29 (×7): qty 1

## 2016-09-29 MED ORDER — SODIUM CHLORIDE 0.9% FLUSH
3.0000 mL | INTRAVENOUS | Status: DC | PRN
  Administered 2016-09-29: 3 mL via INTRAVENOUS
  Filled 2016-09-29: qty 3

## 2016-09-29 MED ORDER — WITCH HAZEL-GLYCERIN EX PADS: 1.0000 "application " | MEDICATED_PAD | CUTANEOUS | Status: DC | PRN

## 2016-09-29 MED ORDER — SIMETHICONE 80 MG PO CHEW
80.0000 mg | CHEWABLE_TABLET | ORAL | Status: DC | PRN
Start: 2016-09-29 — End: 2016-10-01

## 2016-09-29 MED ORDER — ACETAMINOPHEN 325 MG PO TABS: 650.0000 mg | ORAL_TABLET | ORAL | Status: DC | PRN

## 2016-09-29 NOTE — Progress Notes (Signed)
Patient screened out for psychosocial assessment since none of the following apply:  Psychosocial stressors documented in mother or baby's chart  Gestation less than 32 weeks  Code at delivery   Infant with anomalies Please contact the Clinical Social Worker if specific needs arise, or by MOB's request.   Nikira Kushnir Boyd-Gilyard, MSW, LCSW Clinical Social Work [phone]  

## 2016-09-29 NOTE — Lactation Note (Signed)
This note was copied from a baby's chart. Lactation Consultation Note Babies in NICU. Mom plans to BF when going home. Babies small under 5 lbs.  Mom has pendulum breast w/med/large everted nipples. Hand expression taught w/no colostrum noted.  Mom shown how to use DEBP & how to disassemble, clean, & reassemble parts.Mom knows to pump q3h for 15-20 min. Discussed pumping important for stimulation. Bullets given w/numbered dots, explained how to use and milk storage.  [place] brochure given w/resources, support groups and [place] services. Patient Name: Girl A [place]irie Stenberg Today's Date: 09/29/2016 Reason for consult: Initial assessment   Maternal Data    Feeding    LATCH Score/Interventions       Type of Nipple: Everted at rest and after stimulation  Comfort (Breast/Nipple): Soft / non-tender           Lactation Tools Discussed/Used Tools: Pump Breast pump type: Double-Electric Breast Pump WIC Program: Yes Pump Review: Setup, frequency, and cleaning;Milk Storage Initiated by:: Allayne Stack RN IBCLC Date initiated:: 09/29/16   Consult Status Consult Status: Follow-up Date: 09/29/16 Follow-up type: In-patient    Theodoro Kalata 09/29/2016, 4:39 AM

## 2016-09-29 NOTE — Progress Notes (Signed)
Post Partum Day #1 Subjective: up ad lib, voiding, tolerating PO and reports fatigue.    Objective: Blood pressure 140/82, pulse 90, temperature 98.4 F (36.9 C), temperature source Oral, resp. rate 18, height 5\' 3"  (1.6 m), weight 246 lb (111.6 kg), last menstrual period 01/28/2016, SpO2 100 %, unknown if currently breastfeeding.  Physical Exam:  General: alert, cooperative, fatigued and no distress Lochia: appropriate Uterine Fundus: firm Incision: 1st deg lac.; healing DVT Evaluation: No evidence of DVT seen on physical exam. No cords or calf tenderness. No significant calf/ankle edema.   Recent Labs  09/28/16 0715 09/28/16 2247  HGB 11.5* 8.9*  HCT 34.8* 26.9*    Assessment/Plan: Plan for discharge tomorrow, Breastfeeding and Lactation consult.  CBC in AM.  Iron ordered for anemia after PPH.    LOS: 1 day   Morene Crocker, CNM 09/29/2016, 7:17 AM

## 2016-09-29 NOTE — Anesthesia Postprocedure Evaluation (Signed)
Anesthesia Post Note  Patient: Danielle Cisneros  Procedure(s) Performed: * No procedures listed *     Patient location during evaluation: Mother Baby Anesthesia Type: Epidural Level of consciousness: awake and alert Pain management: pain level controlled Vital Signs Assessment: post-procedure vital signs reviewed and stable Respiratory status: spontaneous breathing Cardiovascular status: blood pressure returned to baseline Postop Assessment: no headache, no backache, epidural receding, adequate PO intake, no signs of nausea or vomiting and patient able to bend at knees Anesthetic complications: no    Last Vitals:  Vitals:   09/29/16 0231 09/29/16 0614  BP: (!) 93/56 140/82  Pulse: 91 90  Resp: 18 18  Temp: 37 C 36.9 C    Last Pain:  Vitals:   09/29/16 0720  TempSrc:   PainSc: 0-No pain   Pain Goal: Patients Stated Pain Goal: 5 (09/28/16 2145)               Carime Dinkel

## 2016-09-30 LAB — CBC WITH DIFFERENTIAL/PLATELET
BASOS ABS: 0 10*3/uL (ref 0.0–0.1)
BASOS PCT: 0 %
EOS ABS: 0.1 10*3/uL (ref 0.0–0.7)
EOS PCT: 0 %
HCT: 20.5 % — ABNORMAL LOW (ref 36.0–46.0)
Hemoglobin: 6.9 g/dL — CL (ref 12.0–15.0)
Lymphocytes Relative: 24 %
Lymphs Abs: 5.4 10*3/uL — ABNORMAL HIGH (ref 0.7–4.0)
MCH: 26.3 pg (ref 26.0–34.0)
MCHC: 33.7 g/dL (ref 30.0–36.0)
MCV: 78.2 fL (ref 78.0–100.0)
MONO ABS: 0.7 10*3/uL (ref 0.1–1.0)
MONOS PCT: 3 %
NEUTROS ABS: 16.4 10*3/uL — AB (ref 1.7–7.7)
Neutrophils Relative %: 73 %
PLATELETS: 276 10*3/uL (ref 150–400)
RBC: 2.62 MIL/uL — ABNORMAL LOW (ref 3.87–5.11)
RDW: 16.4 % — AB (ref 11.5–15.5)
WBC: 22.6 10*3/uL — ABNORMAL HIGH (ref 4.0–10.5)

## 2016-09-30 LAB — PREPARE RBC (CROSSMATCH)

## 2016-09-30 MED ORDER — OXYCODONE-ACETAMINOPHEN 5-325 MG PO TABS: 2.0000 | ORAL_TABLET | ORAL | 0 refills | Status: DC | PRN

## 2016-09-30 MED ORDER — SODIUM CHLORIDE 0.9 % IV SOLN
Freq: Once | INTRAVENOUS | Status: AC
  Administered 2016-09-30: 13:00:00 via INTRAVENOUS

## 2016-09-30 MED ORDER — IBUPROFEN 600 MG PO TABS: 600.0000 mg | ORAL_TABLET | Freq: Four times a day (QID) | ORAL | 2 refills | Status: DC

## 2016-09-30 NOTE — Discharge Summary (Signed)
OB Discharge Summary     Patient Name: Danielle Cisneros DOB: Sep 26, 1990 MRN: [id number]  Date of admission: 09/28/2016 Delivering MD:    Dena Billet, Girl A Darnesha [[id number]]  Kateryna, Grantham Alvetta [[id number]]  Loma Boston J   Date of discharge: 09/30/2016  Admitting diagnosis: TWINS 58 WEEKS CTX ROM Intrauterine pregnancy: [redacted]w[redacted]d     Secondary diagnosis:  Active Problems:   Preterm premature rupture of membranes (PPROM) with unknown onset of labor  Additional problems: PPH with EBL 1900 ml     Discharge diagnosis: Preterm Pregnancy Delivered and twin pregnancy, morbid obesity, hx of uterine fibroid                                                                                                Post partum procedures:blood transfusion  Augmentation: Pitocin  Complications: [id number]  Hospital course:  Onset of Labor With Vaginal Delivery     26 y.o. yo [id number] at [redacted]w[redacted]d was admitted in Active Labor on 09/28/2016. Patient had an uncomplicated labor course as follows:  Membrane Rupture Time/Date:    Dameka, Younker Girl A Gennell [[id number]]  5:30 AM   Geneva, Barrero Rendi [[id number]]  9:23 PM ,   Yore, Girl A Olivette [[id number]]  09/28/2016   Railyn, House [[id number]]  09/28/2016   Intrapartum Procedures: Episiotomy:    Laryssa, Hassing Girl A Preslynn [[id number]]  None [1]   Kaleigh, Spiegelman Kamela [[id number]]  None [1]                                         Lacerations:     Jaquia, Benedicto Girl A Chelise [[id number]]  1st degree [2]   Leva, Baine [[id number]]  1st degree [2]  Patient had a delivery of a Viable infant.   Merie, Wulf Girl A Teshara [[id number]]  09/28/2016   Kamilia, Carollo [[id number]]  09/28/2016 Information for the patient's newborn:  Braniya, Farrugia Girl A Yalanda [[id number]]    Information for the patient's newborn:  Iness, Pangilinan [[id number]]       Pateint had an uncomplicated postpartum course.  She is ambulating,  tolerating a regular diet, passing flatus, and urinating well. Patient is discharged home in stable condition on 09/30/16.   Physical exam  Vitals:   09/29/16 0614 09/29/16 1215 09/29/16 1700 09/30/16 0527  BP: 140/82 (!) 96/47 (!) 90/41 (!) 94/45  Pulse: 90 80 87 84  Resp: 18 20 19 18   Temp: 98.4 F (36.9 C) 98.3 F (36.8 C) 98.5 F (36.9 C) 98.1 F (36.7 C)  TempSrc: Oral Oral Oral Oral  SpO2:      Weight:      Height:       General: alert, cooperative and no distress Lochia: appropriate Uterine Fundus: firm Incision: N/A DVT Evaluation: No evidence of DVT seen on physical exam. No cords or calf tenderness. No significant calf/ankle edema. Labs: Lab Results  Component Value Date   WBC 22.6 (H) 09/30/2016   HGB 6.9 (LL) 09/30/2016  HCT 20.5 (L) 09/30/2016   MCV 78.2 09/30/2016   PLT 276 09/30/2016   CMP Latest Ref Rng & Units 07/14/2015  Glucose 65 - 99 mg/dL 82  BUN 7 - 25 mg/dL 13  Creatinine 0.50 - 1.10 mg/dL 1.09  Sodium 135 - 146 mmol/L 137  Potassium 3.5 - 5.3 mmol/L 4.3  Chloride 98 - 110 mmol/L 104  CO2 20 - 31 mmol/L 23  Calcium 8.6 - 10.2 mg/dL 9.8  Total Protein 6.1 - 8.1 g/dL 8.3(H)  Total Bilirubin 0.2 - 1.2 mg/dL 0.3  Alkaline Phos 33 - 115 U/L 50  AST 10 - 30 U/L 22  ALT 6 - 29 U/L 15    Discharge instruction: per After Visit Summary and "Baby and Me Booklet".  After visit meds:  Allergies as of 09/30/2016   No Known Allergies     Medication List    STOP taking these medications   aspirin EC 81 MG tablet   HYDROmorphone 2 MG tablet Commonly known as:  DILAUDID     TAKE these medications   docusate sodium 100 MG capsule Commonly known as:  COLACE Take 100 mg by mouth 2 (two) times daily.   FERRALET 90 90-1 MG Tabs Take 1 tablet by mouth daily.   ibuprofen 600 MG tablet Commonly known as:  ADVIL,MOTRIN Take 1 tablet (600 mg total) by mouth every 6 (six) hours.   oxyCODONE-acetaminophen 5-325 MG tablet Commonly known as:   PERCOCET/ROXICET Take 2 tablets by mouth every 4 (four) hours as needed (pain scale > 7).   prenatal vitamin w/FE, FA 29-1 MG Chew chewable tablet Chew 1 tablet by mouth daily at 12 noon.       Diet: routine diet  Activity: Advance as tolerated. Pelvic rest for 6 weeks.   Outpatient follow up:4 weeks Follow up Appt:No future appointments. Follow up Visit:No Follow-up on file.  Postpartum contraception: Not Discussed  Newborn Data:   Catherin, Doorn [[id number]]  Live born female  Birth Weight: 4 lb 11.8 oz (2150 g) APGAR: 9, 9   Gage, Treiber [[id number]]  Live born female  Birth Weight: 4 lb 4.4 oz (1940 g) APGAR: 7, 9  Baby Feeding: Bottle and Breast Disposition:NICU   09/30/2016 Morene Crocker, CNM

## 2016-09-30 NOTE — Progress Notes (Signed)
Patient completed transfusion of 2 units PRBCs @ 1515. No complains or signs and symptoms of reaction.

## 2016-09-30 NOTE — Progress Notes (Signed)
Post Partum Day #2 Subjective: up ad lib, voiding, tolerating PO and reports fatigue and occasional vertigo.  Infants in NICU.    Objective: Blood pressure (!) 94/45, pulse 84, temperature 98.1 F (36.7 C), temperature source Oral, resp. rate 18, height 5\' 3"  (1.6 m), weight 246 lb (111.6 kg), last menstrual period 01/28/2016, SpO2 100 %, unknown if currently breastfeeding.  Physical Exam:  General: alert, cooperative and no distress Lochia: appropriate Uterine Fundus: firm Incision: none DVT Evaluation: No evidence of DVT seen on physical exam. No cords or calf tenderness. No significant calf/ankle edema.   Recent Labs  09/29/16 0741 09/30/16 0537  HGB 7.3* 6.9*  HCT 21.5* 20.5*    Assessment/Plan: Discharge home, Breastfeeding, Lactation consult and Social Work consult.  SW and lactation consults completed for twin infants in NICU.  Reported to be doing well.  Pumping, not producing much.  S/P PPH with EBL of 1900.  HgB this am 6.9.  Hypotension noted.  Discussed blood transfusion in rounds: ok for 2 units. 2 units ordered with plan for late discharge home today.    LOS: 2 days   Morene Crocker, CNM 09/30/2016, 8:14 AM

## 2016-09-30 NOTE — Progress Notes (Signed)
CRITICAL VALUE ALERT  Critical Value:  Hemoglobin 6.9   Date & Time Notied:  09/30/2016 0710  Provider Notified: Chrys Racer CNM  Orders Received/Actions taken: No new orders at this time

## 2016-09-30 NOTE — Lactation Note (Signed)
This note was copied from a baby's chart. Lactation Consultation Note  Patient Name: Danielle Cisneros Today's Date: 09/30/2016 Reason for consult: Follow-up assessment   With this first time mom of twins, in NICU, now 10 hours old and 35 1/7 weeks CGA. Mom is receiving 2 units of blood today, but later today, would like to try and latch her babies. I spoke to both Ernestine Mcmurray., RN and Pamala Hurry., RN, who will be taking over at 3 pm for these babies. Mom's milk is transitioning her, and she was able to pump about 7 ml's. I showed mom how to use the maintenance pump setting, and to pump at least 15 minutes, no more than 30, until her milk stops dripping. Mom encouraged to pump at least 8 times a day, around the clock. Mom very motivated, and eager to bring her girls home and breast feed them. The babies are not bottle feeding yet, and slow on feeding cues. Mom knows to call lactation for questions/concerns   Maternal Data    Feeding    LATCH Score/Interventions                      Lactation Tools Discussed/Used     Consult Status Consult Status: Follow-up Date: 10/01/16 Follow-up type: In-patient    [name] 09/30/2016, 11:59 AM

## 2016-10-01 ENCOUNTER — Ambulatory Visit: Payer: Self-pay

## 2016-10-01 LAB — TYPE AND SCREEN
ABO/RH(D): O NEG
Antibody Screen: POSITIVE
DAT, IGG: NEGATIVE
UNIT DIVISION: 0
UNIT DIVISION: 0

## 2016-10-01 LAB — BPAM RBC
BLOOD PRODUCT EXPIRATION DATE: 201807112359
Blood Product Expiration Date: 201807102359
ISSUE DATE / TIME: 201806281306
ISSUE DATE / TIME: 201806280937
UNIT TYPE AND RH: 9500
Unit Type and Rh: 9500

## 2016-10-01 NOTE — Lactation Note (Signed)
This note was copied from a baby's chart. Lactation Consultation Note  Patient Name: Danielle Cisneros [id number] Date: 10/01/2016 Reason for consult: Follow-up assessment  With this mom of NICU twins. I observed mom pumping with her home Lasinoh pump, and it was working well, flanges fit well, and she was expressing a very good amount of milk.  I was able to find an extra pair of medela pump connectors, so mom can use the Medela DEP when visiting the hospital.   Maternal Data    Feeding Feeding Type: Breast Fed (with gavage feeding EBM) Length of feed: 30 min  LATCH Score/Interventions Latch: Repeated attempts needed to sustain latch, nipple held in mouth throughout feeding, stimulation needed to elicit sucking reflex. Intervention(s): Adjust position;Assist with latch  Audible Swallowing: None  Type of Nipple: Everted at rest and after stimulation  Comfort (Breast/Nipple): Soft / non-tender  Problem noted: Filling  Hold (Positioning): Assistance needed to correctly position infant at breast and maintain latch. Intervention(s): Breastfeeding basics reviewed;Support Pillows;Position options;Skin to skin  LATCH Score: 6  Lactation Tools Discussed/Used     Consult Status Consult Status: PRN Follow-up type: In-patient (NICU)    [name] 10/01/2016, 1:53 PM

## 2016-10-01 NOTE — Lactation Note (Signed)
This note was copied from a baby's chart. Lactation Consultation Note  Patient Name: [name] DTHYH'O Date: 10/01/2016 Reason for consult: Follow-up assessment   With this mom of NICU twins, now 33 hours old and 35 2/7 weeks CGA. Baby B is small, weight 3 lbs 15 oz. Mom's nipple fills the baby's mouth, so it was gently placed in the baby's mouth. Mom could feel suckles, but I could not see them. The baby calmed as soon as she was skin to skin with mom. The baby was ng fed during the latch. Mom smiling, and very calm and appears content also.    Maternal Data    Feeding Feeding Type: Breast Fed Nipple Type: Slow - flow Length of feed: 40 min (10" PO)  LATCH Score/Interventions Latch: Repeated attempts needed to sustain latch, nipple held in mouth throughout feeding, stimulation needed to elicit sucking reflex. Intervention(s): Adjust position;Assist with latch  Audible Swallowing: None  Type of Nipple: Everted at rest and after stimulation  Comfort (Breast/Nipple): Soft / non-tender  Problem noted: Filling  Hold (Positioning): Assistance needed to correctly position infant at breast and maintain latch. Intervention(s): Breastfeeding basics reviewed;Support Pillows;Position options;Skin to skin  LATCH Score: 6  Lactation Tools Discussed/Used     Consult Status Consult Status: PRN Follow-up type:  (NICU)    [name] 10/01/2016, 12:10 PM

## 2016-10-01 NOTE — Lactation Note (Signed)
This note was copied from a baby's chart. Lactation Consultation Note  Patient Name: Danielle Cisneros Today's Date: 10/01/2016 Reason for consult: Follow-up assessment   With this mom of NICU twins, now 61 hours ld, and 35 2/7 weeks CGA. I assisted mom with latching Baby a firs the first time,in cross cradle hold. Mom and baby did great. The baby suckled on and of for 20 minutes, with some intermittent visible swallows. I will also assist mom with baby B's feeding at 12 noon, and after mom will pump with her lasinoh pump, and I will observe her pumping.    Maternal Data    Feeding Feeding Type: Breast Fed  LATCH Score/Interventions Latch: Grasps breast easily, tongue down, lips flanged, rhythmical sucking.  Audible Swallowing: A few with stimulation (visible swalows seen) Intervention(s): Skin to skin;Hand expression  Type of Nipple: Everted at rest and after stimulation  Comfort (Breast/Nipple): Soft / non-tender  Problem noted: Filling  Hold (Positioning): Assistance needed to correctly position infant at breast and maintain latch. Intervention(s): Breastfeeding basics reviewed;Support Pillows;Position options;Skin to skin  LATCH Score: 8  Lactation Tools Discussed/Used     Consult Status Consult Status: PRN Follow-up type: In-patient    [name] 10/01/2016, 11:19 AM

## 2016-10-07 ENCOUNTER — Other Ambulatory Visit (HOSPITAL_COMMUNITY): Payer: BLUE CROSS/BLUE SHIELD

## 2016-10-12 ENCOUNTER — Ambulatory Visit: Payer: Self-pay

## 2016-10-12 NOTE — Lactation Note (Signed)
This note was copied from a baby's chart. Lactation Consultation Note  Patient Name: Girl A Latysha Thackston Today's Date: 10/12/2016 Reason for consult: Follow-up assessment;NICU baby;Infant < 6lbs;Multiple gestation   Follow up with mom at infant's bedside. She reports pumping is going well. She has no questions/concerns at this time.    Maternal Data    Feeding Feeding Type: Breast Milk with Formula added Nipple Type: Slow - flow Length of feed: 30 min  LATCH Score/Interventions                      Lactation Tools Discussed/Used     Consult Status Consult Status: PRN Follow-up type: Call as needed    Donn Pierini 10/12/2016, 2:01 PM

## 2016-10-13 ENCOUNTER — Ambulatory Visit: Payer: Self-pay

## 2016-10-13 NOTE — Lactation Note (Signed)
This note was copied from a baby's chart. Lactation Consultation Note  Patient Name: Danielle Cisneros Today's Date: 10/13/2016  Mom concerned because she is pumping only 20-30 mls from right breast and 60 mls from left breast.  She is pumping about 7 times in 24 hours.  Recommended she try to increase frequency of pumping during the daytime hours.  Both babies latching to breast well.  No signs of plugged duct or mastitis.   Maternal Data    Feeding Feeding Type: Breast Milk with Formula added Nipple Type: Slow - flow Length of feed: 30 min  LATCH Score/Interventions                      Lactation Tools Discussed/Used     Consult Status      Ave Filter 10/13/2016, 1:40 PM

## 2016-10-27 ENCOUNTER — Ambulatory Visit (INDEPENDENT_AMBULATORY_CARE_PROVIDER_SITE_OTHER): Payer: BLUE CROSS/BLUE SHIELD | Admitting: Certified Nurse Midwife

## 2016-10-27 VITALS — BP 113/73 | HR 82 | Wt 237.0 lb

## 2016-10-27 DIAGNOSIS — M25532 Pain in left wrist: Secondary | ICD-10-CM

## 2016-10-27 NOTE — Progress Notes (Signed)
Post Partum Exam  Danielle Cisneros is a 26 y.o. [phone] female who presents for a postpartum visit. She is 4 weeks postpartum following a spontaneous vaginal delivery. I have fully reviewed the prenatal and intrapartum course. The delivery was at 34.6 gestational weeks.  Anesthesia: epidural. Postpartum course has been doing well. Baby's course has been doing well. Baby is feeding by both breast and bottle - Similac Neosure. Bleeding thin lochia. Bowel function is abnormal: some constipation. Bladder function is normal. Patient is not sexually active. Contraception method is abstinence. Would like to discuss Birth Control options. Postpartum depression screening:neg, score 0.  States that twins are doing well and breast feeding as much as possible.  Does state that she has left hand thumb pain that started after delivery, denies any trauma to her hand, does not carry car seats with that hand.  States that it is very hard to carry or pick up infants with that hand.  Has tried ace wrap without any relief of pain.   The following portions of the patient's history were reviewed and updated as appropriate: allergies, current medications, past family history, past medical history, past social history, past surgical history and problem list.  Review of Systems Pertinent items noted in HPI and remainder of comprehensive ROS otherwise negative.    Objective:  unknown if currently breastfeeding.  General:  alert, cooperative and no distress   Breasts:  inspection negative, no nipple discharge or bleeding, no masses or nodularity palpable  Lungs: clear to auscultation bilaterally  Heart:  regular rate and rhythm, S1, S2 normal, no murmur, click, rub or gallop  Abdomen: soft, non-tender; bowel sounds normal; no masses,  no organomegaly  Pelvic Exam: Not performed.        Assessment:    Normal 4 week postpartum exam. Pap smear not done at today's visit.   Contraception counseling  Pap smear: 04/13/16: normal  ?carpel tunnel/ligament pain of left thumb: ortho consult given.  Plan:   1. Contraception: abstinence 2. IUD for contraception discussed.   3. Follow up in: 2 weeks for IUD or as needed.

## 2016-10-28 ENCOUNTER — Encounter: Payer: Self-pay | Admitting: Certified Nurse Midwife

## 2016-11-11 ENCOUNTER — Ambulatory Visit (INDEPENDENT_AMBULATORY_CARE_PROVIDER_SITE_OTHER): Payer: BLUE CROSS/BLUE SHIELD | Admitting: Certified Nurse Midwife

## 2016-11-11 ENCOUNTER — Encounter: Payer: Self-pay | Admitting: *Deleted

## 2016-11-11 ENCOUNTER — Encounter: Payer: Self-pay | Admitting: Certified Nurse Midwife

## 2016-11-11 VITALS — BP 103/69 | HR 78 | Wt 241.8 lb

## 2016-11-11 DIAGNOSIS — Z30430 Encounter for insertion of intrauterine contraceptive device: Secondary | ICD-10-CM | POA: Diagnosis not present

## 2016-11-11 DIAGNOSIS — Z30014 Encounter for initial prescription of intrauterine contraceptive device: Secondary | ICD-10-CM

## 2016-11-11 DIAGNOSIS — N852 Hypertrophy of uterus: Secondary | ICD-10-CM

## 2016-11-11 DIAGNOSIS — Z3202 Encounter for pregnancy test, result negative: Secondary | ICD-10-CM

## 2016-11-11 LAB — POCT URINE PREGNANCY: Preg Test, Ur: NEGATIVE

## 2016-11-11 MED ORDER — LEVONORGESTREL 20 MCG/24HR IU IUD
INTRAUTERINE_SYSTEM | Freq: Once | INTRAUTERINE | Status: AC
  Administered 2016-11-11: 13:00:00 via INTRAUTERINE

## 2016-11-11 NOTE — Progress Notes (Signed)
Patient is in the office for Mirena insertion. Patient is not sexually active at this time.

## 2016-11-15 ENCOUNTER — Encounter: Payer: Self-pay | Admitting: Certified Nurse Midwife

## 2016-11-15 DIAGNOSIS — Z30014 Encounter for initial prescription of intrauterine contraceptive device: Secondary | ICD-10-CM | POA: Insufficient documentation

## 2016-11-15 DIAGNOSIS — N852 Hypertrophy of uterus: Secondary | ICD-10-CM | POA: Insufficient documentation

## 2016-11-15 NOTE — Progress Notes (Signed)
IUD Procedure Note   DIAGNOSIS: Desires long-term, reversible contraception   PROCEDURE: IUD placement Performing Provider: Kandis Cocking CNM  Patient counseled prior to procedure. I explained risks and benefits of Mirena IUD, reviewed alternative forms of contraception. Patient stated understanding and consented to continue with procedure.   LMP: unknown, postpartum, breast feeding.  6 weeks postpartum from twin delivery.  Pregnancy Test: Negative Lot #: [id number] Expiration Date: Nov 2020   IUD type: [X]  Mirena   [   ] Paragard  [   ] Diamantina Monks   [   ]  Kyleena  PROCEDURE:  Timeout procedure was performed to ensure right patient and right site.  A bimanual exam was performed to determine the position of the uterus, retroverted. The speculum was placed. The vagina and cervix was sterilized in the usual manner and sterile technique was maintained throughout the course of the procedure. A single toothed tenaculum was not required. The depth of the uterus was sounded to 14 cm, Dr. Jodi Mourning consulted prior to insertion d/t large uterus. Post procedure [place] ordered per Dr. Jodi Mourning recommendations.  The IUD was inserted to the appropriate depth and inserted without difficulty.  The string was cut to an estimated 4 cm length. Bleeding was minimal. The patient tolerated the procedure well.   Follow up: The patient tolerated the procedure well without complications.  Standard post-procedure care is explained and return precautions are given.  Kandis Cocking CNM

## 2016-11-17 ENCOUNTER — Ambulatory Visit (HOSPITAL_COMMUNITY)
Admission: RE | Admit: 2016-11-17 | Discharge: 2016-11-17 | Disposition: A | Discharge status: Home / Self Care | Payer: BLUE CROSS/BLUE SHIELD | Source: Ambulatory Visit | Attending: Certified Nurse Midwife | Admitting: Certified Nurse Midwife

## 2016-11-17 DIAGNOSIS — D259 Leiomyoma of uterus, unspecified: Secondary | ICD-10-CM | POA: Diagnosis not present

## 2016-11-17 DIAGNOSIS — N852 Hypertrophy of uterus: Secondary | ICD-10-CM

## 2016-11-17 DIAGNOSIS — Z30014 Encounter for initial prescription of intrauterine contraceptive device: Secondary | ICD-10-CM

## 2016-11-17 DIAGNOSIS — Z975 Presence of (intrauterine) contraceptive device: Secondary | ICD-10-CM | POA: Insufficient documentation

## 2016-11-22 ENCOUNTER — Other Ambulatory Visit: Payer: Self-pay | Admitting: Certified Nurse Midwife

## 2016-12-09 ENCOUNTER — Encounter: Payer: Self-pay | Admitting: Certified Nurse Midwife

## 2016-12-09 ENCOUNTER — Ambulatory Visit (INDEPENDENT_AMBULATORY_CARE_PROVIDER_SITE_OTHER): Payer: BLUE CROSS/BLUE SHIELD | Admitting: Certified Nurse Midwife

## 2016-12-09 VITALS — BP 103/69 | HR 83 | Ht 62.0 in | Wt 249.0 lb

## 2016-12-09 DIAGNOSIS — Z308 Encounter for other contraceptive management: Secondary | ICD-10-CM | POA: Diagnosis not present

## 2016-12-09 DIAGNOSIS — Z30431 Encounter for routine checking of intrauterine contraceptive device: Secondary | ICD-10-CM

## 2016-12-09 NOTE — Progress Notes (Signed)
Patient is in the office for 1 month string check. IUD place 11-11-16.

## 2016-12-09 NOTE — Progress Notes (Signed)
Patient ID: Danielle Cisneros, female   DOB: [birth year], 26 y.o.   MRN: [id number]  Chief Complaint  Patient presents with  . GYN    HPI Danielle Cisneros is a 26 y.o. female.  Here for IUD f/u.  Danielle Cisneros obtained d/t enlarged postpartum uterus after having twin delivery.  Discussed probability of expulsion d/t fibroids.  Has an appointment scheduled for 12/16/16 with Dr. Elly Modena to discuss options d/t fibroids.  Educated to use condoms in the mean time. Is not currently having a period.  Likes the IUD.   Danielle Cisneros results below.   HPI  Past Medical History:  Diagnosis Date  . Medical history non-contributory     Past Surgical History:  Procedure Laterality Date  . NO PAST SURGERIES      No family history on file.  Social History Social History  Substance Use Topics  . Smoking status: Never Smoker  . Smokeless tobacco: Never Used  . Alcohol use No    No Known Allergies  Current Outpatient Prescriptions  Medication Sig Dispense Refill  . prenatal vitamin w/FE, FA (NATACHEW) 29-1 MG CHEW chewable tablet Chew 1 tablet by mouth daily at 12 noon. 30 tablet 12  . docusate sodium (COLACE) 100 MG capsule Take 100 mg by mouth 2 (two) times daily.    Marland Kitchen ibuprofen (ADVIL,MOTRIN) 600 MG tablet Take 1 tablet (600 mg total) by mouth every 6 (six) hours. (Patient not taking: Reported on 12/09/2016) 120 tablet 2   No current facility-administered medications for this visit.     Review of Systems Review of Systems Constitutional: negative for fatigue and weight loss Respiratory: negative for cough and wheezing Cardiovascular: negative for chest pain, fatigue and palpitations Gastrointestinal: negative for abdominal pain and change in bowel habits Genitourinary:negative Integument/breast: negative for nipple discharge Musculoskeletal:negative for myalgias Neurological: negative for gait problems and tremors Behavioral/Psych: negative for abusive relationship, depression Endocrine: negative for temperature  intolerance      Blood pressure 103/69, pulse 83, height 5\' 2"  (1.575 m), weight 249 lb (112.9 kg), currently breastfeeding.  Physical Exam Physical Exam General:   alert  Skin:   no rash or abnormalities  Pelvis:  External genitalia: normal general appearance Urinary system: urethral meatus normal and bladder without fullness, nontender Vaginal: normal without tenderness, induration or masses Cervix: no CMT, strings palpated, fibroid encroaching on lower uterine walls palpated towards posterior cervix.   Adnexa: not palpated Uterus: anteverted and non-tender, enlarged in size    50% of 15 min visit spent on counseling and coordination of care.    CLINICAL DATA:  IUD placement Check  EXAM: TRANSABDOMINAL AND TRANSVAGINAL ULTRASOUND OF PELVIS  TECHNIQUE: Both transabdominal and transvaginal ultrasound examinations of the pelvis were performed. Transabdominal technique was performed for global imaging of the pelvis including uterus, ovaries, adnexal regions, and pelvic cul-de-sac. It was necessary to proceed with endovaginal exam following the transabdominal exam to visualize the uterus, endometrium, ovaries and adnexa .  COMPARISON:  None  FINDINGS: Uterus  Measurements: 18.2 x 7.5 x 14.9 cm. Large posterior right body fibroid measures up to 10.5 cm. Large fundal fibroid measures up to 7.0 cm.  Endometrium  Thickness: Difficult to visualize due to fibroids. IUD is noted within the endometrial canal.  Right ovary  Measurements: Not visualized. No adnexal mass seen  Left ovary  Measurements: 4.1 x 1.4 x 3.0 cm. Normal appearance/no adnexal mass.  Other findings  No abnormal free fluid.  IMPRESSION: Enlarged fibroid uterus.  IUD in the endometrial canal.  Electronically Signed   By: Rolm Baptise M.D.   On: 11/17/2016 10:45   Data Reviewed Previous medical hx, meds, labs, Danielle Cisneros results  Assessment     Multiple large uterine  fibroids IUD check up    Plan    GYN visit for fibroids next week to discuss options of treatment and contraception.

## 2016-12-16 ENCOUNTER — Ambulatory Visit: Payer: BLUE CROSS/BLUE SHIELD | Admitting: Obstetrics and Gynecology

## 2017-04-14 ENCOUNTER — Encounter: Payer: Self-pay | Admitting: Certified Nurse Midwife

## 2017-04-14 ENCOUNTER — Ambulatory Visit (INDEPENDENT_AMBULATORY_CARE_PROVIDER_SITE_OTHER): Payer: BLUE CROSS/BLUE SHIELD | Admitting: Certified Nurse Midwife

## 2017-04-14 VITALS — BP 99/68 | HR 67 | Wt 268.8 lb

## 2017-04-14 DIAGNOSIS — Z30432 Encounter for removal of intrauterine contraceptive device: Secondary | ICD-10-CM | POA: Diagnosis not present

## 2017-04-14 DIAGNOSIS — R635 Abnormal weight gain: Secondary | ICD-10-CM

## 2017-04-14 DIAGNOSIS — Z3169 Encounter for other general counseling and advice on procreation: Secondary | ICD-10-CM

## 2017-04-14 MED ORDER — PRENATE PIXIE 10-0.6-0.4-200 MG PO CAPS
1.0000 | ORAL_CAPSULE | Freq: Every day | ORAL | 12 refills | Status: DC
Start: 2017-04-14 — End: 2017-04-14

## 2017-04-14 MED ORDER — PRENATE PIXIE 10-0.6-0.4-200 MG PO CAPS: 1.0000 | ORAL_CAPSULE | Freq: Every day | ORAL | 12 refills | Status: AC

## 2017-04-15 LAB — CBC
Hematocrit: 37.2 % (ref 34.0–46.6)
Hemoglobin: 11.4 g/dL (ref 11.1–15.9)
MCH: 22.9 pg — ABNORMAL LOW (ref 26.6–33.0)
MCHC: 30.6 g/dL — ABNORMAL LOW (ref 31.5–35.7)
MCV: 75 fL — ABNORMAL LOW (ref 79–97)
PLATELETS: 313 10*3/uL (ref 150–379)
RBC: 4.97 x10E6/uL (ref 3.77–5.28)
RDW: 16.6 % — AB (ref 12.3–15.4)
WBC: 9.6 10*3/uL (ref 3.4–10.8)

## 2017-04-15 LAB — COMPREHENSIVE METABOLIC PANEL
ALK PHOS: 95 IU/L (ref 39–117)
ALT: 18 IU/L (ref 0–32)
AST: 23 IU/L (ref 0–40)
Albumin/Globulin Ratio: 1.2 (ref 1.2–2.2)
Albumin: 4.5 g/dL (ref 3.5–5.5)
BILIRUBIN TOTAL: 0.3 mg/dL (ref 0.0–1.2)
BUN / CREAT RATIO: 10 (ref 9–23)
BUN: 7 mg/dL (ref 6–20)
CHLORIDE: 103 mmol/L (ref 96–106)
CO2: 25 mmol/L (ref 20–29)
Calcium: 9.8 mg/dL (ref 8.7–10.2)
Creatinine, Ser: 0.73 mg/dL (ref 0.57–1.00)
GFR calc Af Amer: 130 mL/min/{1.73_m2} (ref >59)
GFR calc non Af Amer: 112 mL/min/{1.73_m2} (ref >59)
GLOBULIN, TOTAL: 3.8 g/dL (ref 1.5–4.5)
Glucose: 90 mg/dL (ref 65–99)
Potassium: 4.4 mmol/L (ref 3.5–5.2)
SODIUM: 144 mmol/L (ref 134–144)
Total Protein: 8.3 g/dL (ref 6.0–8.5)

## 2017-04-15 LAB — TSH: TSH: 3.86 u[IU]/mL (ref 0.450–4.500)

## 2017-04-15 LAB — HEMOGLOBIN A1C
Est. average glucose Bld gHb Est-mCnc: 126 mg/dL
Hgb A1c MFr Bld: 6 % — ABNORMAL HIGH (ref 4.8–5.6)

## 2017-04-18 ENCOUNTER — Other Ambulatory Visit: Payer: Self-pay | Admitting: Certified Nurse Midwife

## 2017-04-18 NOTE — Progress Notes (Signed)
    GYNECOLOGY OFFICE PROCEDURE NOTE  Danielle Cisneros is a 27 y.o. [id number] here for Mirena IUD removal. No GYN concerns. Reported about 40# weight gain with IUD. Desires pregnancy and weight loss.  Last pap smear was on 04/13/16 and was normal.  IUD Removal  Patient identified, informed consent performed, consent signed.  Patient was in the dorsal lithotomy position, normal external genitalia was noted.  A speculum was placed in the patient's vagina, normal discharge was noted, no lesions. The cervix was visualized, no lesions, no abnormal discharge.  The strings of the IUD were grasped and pulled using ring forceps. The IUD was removed in its entirety.  Patient tolerated the procedure well.    Patient will use nothing for contraception andplans for pregnancy soon and she was told to avoid teratogens, take PNV and folic acid.  Routine preventative health maintenance measures emphasized.   Kandis Cocking, [place] for [employer], [place]

## 2017-04-19 ENCOUNTER — Encounter: Payer: Self-pay | Admitting: *Deleted

## 2017-04-19 ENCOUNTER — Other Ambulatory Visit: Payer: Self-pay | Admitting: Certified Nurse Midwife

## 2018-03-05 IMAGING — US US OB COMP LESS 14 WK
1 series · 15 of 28 positions shown · non-contrast
Comparison: None.

CLINICAL DATA: 25-year-old G 1, LMP 09/10/2015 ([DATE] weeks 1 day, EDC
06/16/2016 by LMP), quantitative beta HCG [DATE], presenting with 7
day history of vaginal bleeding.

EXAM:
OBSTETRIC <14 WK US AND TRANSVAGINAL OB US
TECHNIQUE: Both transabdominal and transvaginal ultrasound examinations were
performed for complete evaluation of the gestation as well as the
maternal uterus, adnexal regions, and pelvic cul-de-sac.
Transvaginal technique was performed to assess early pregnancy.

[Series 1: us ob comp less 14 wk · 15 of 69 slices shown]
[im 1/69]
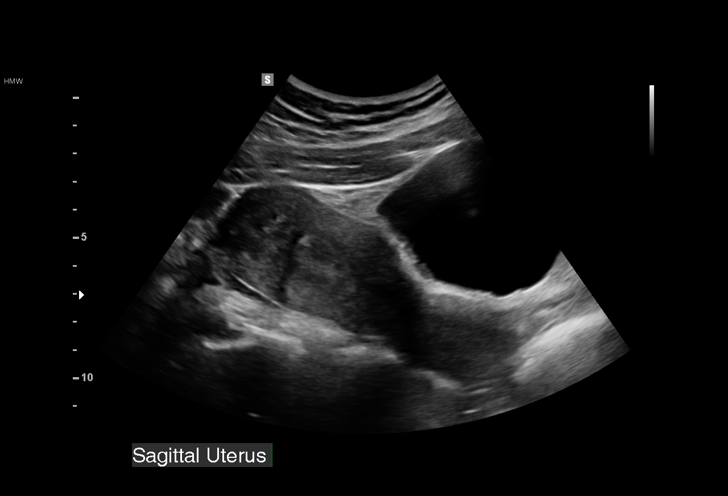
[im 6/69]
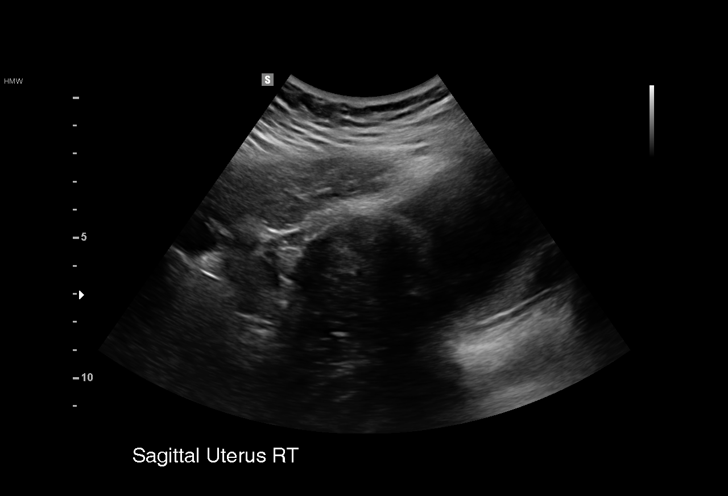
[im 11/69]
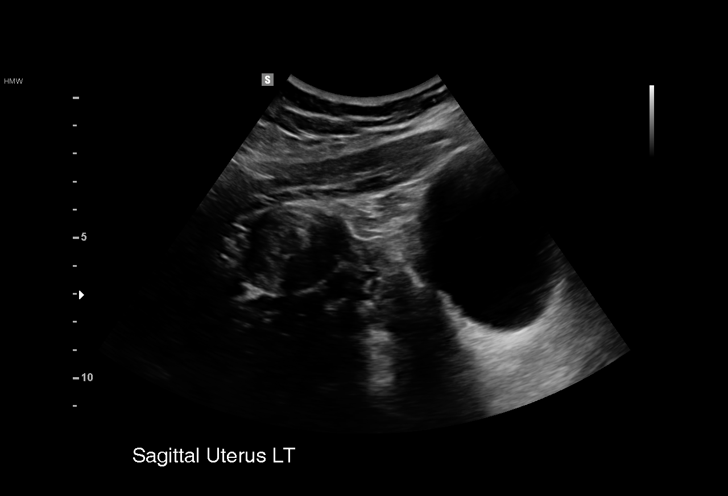
[im 16/69]
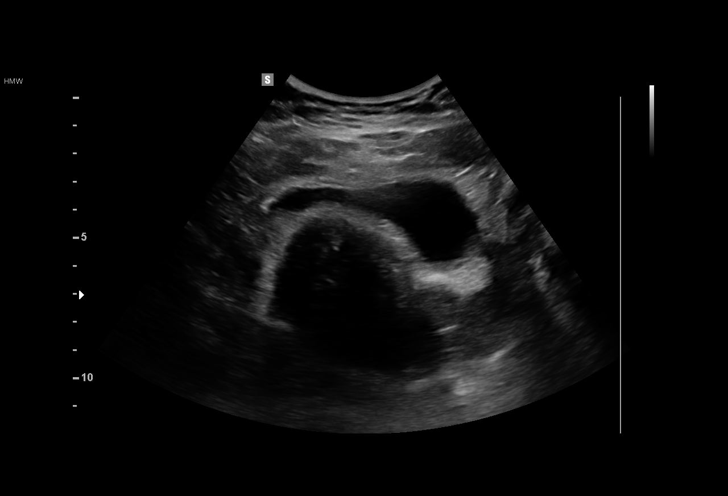
[im 21/69]
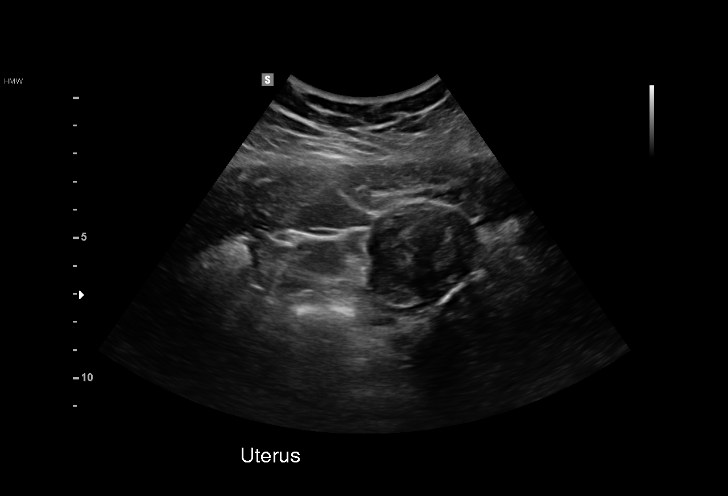
[im 26/69]
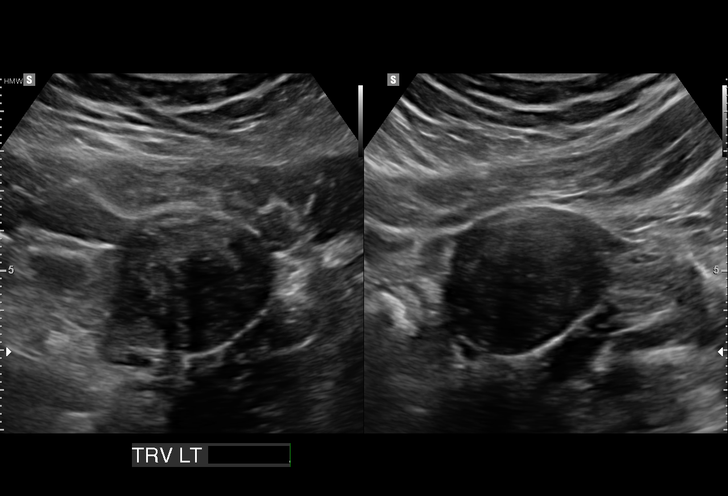
[im 31/69]
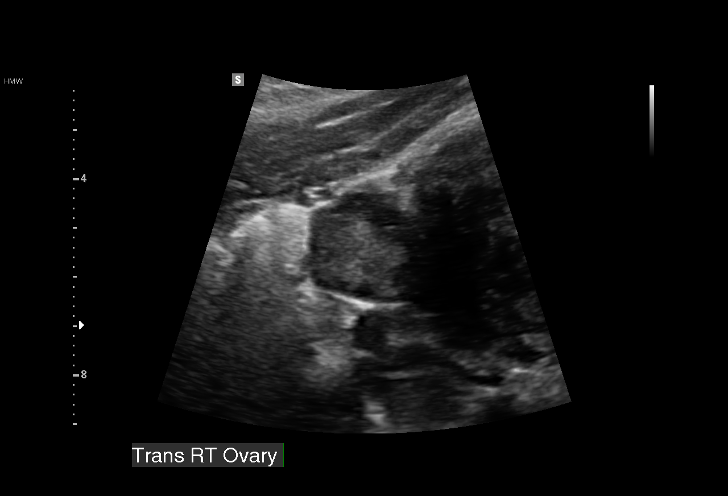
[im 36/69]
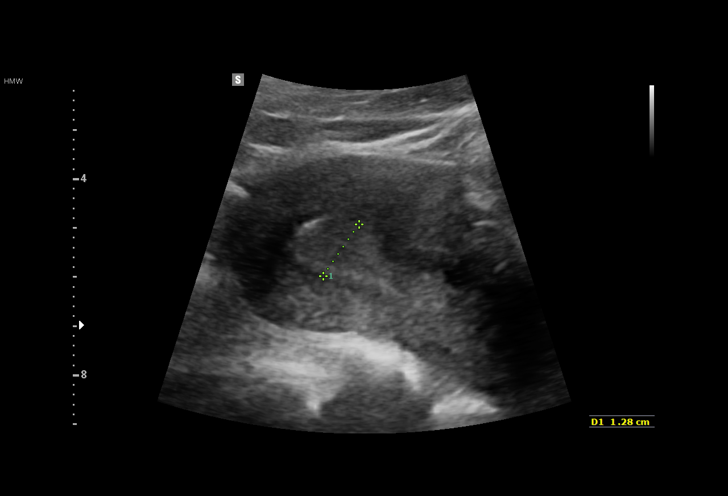
[im 38/69]
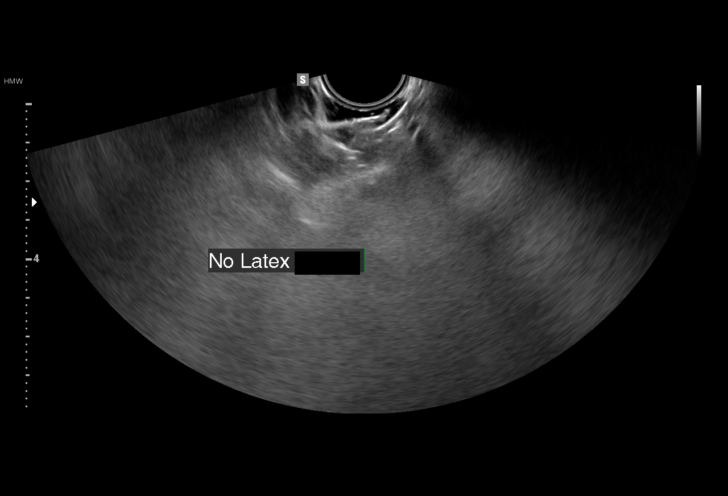
[im 43/69]
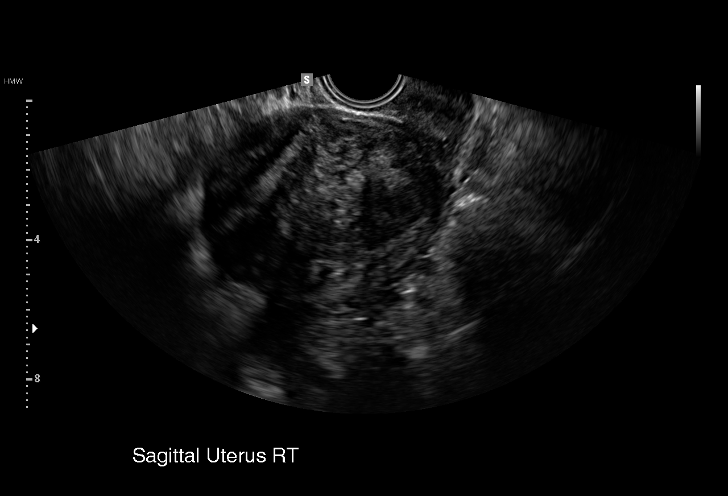
[im 48/69]
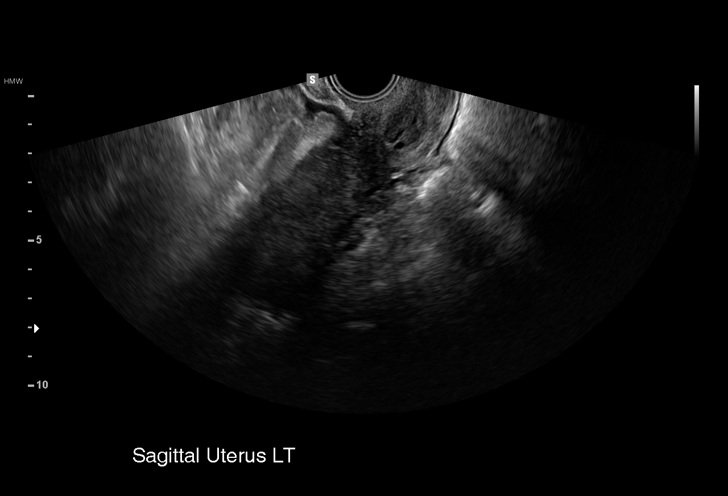
[im 53/69]
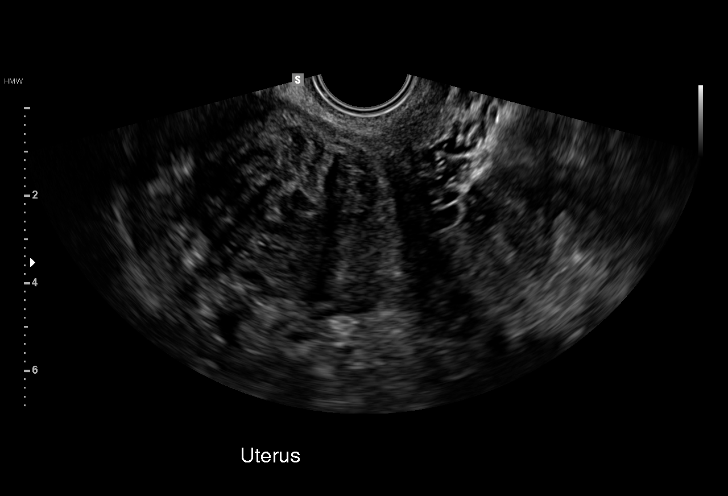
[im 58/69]
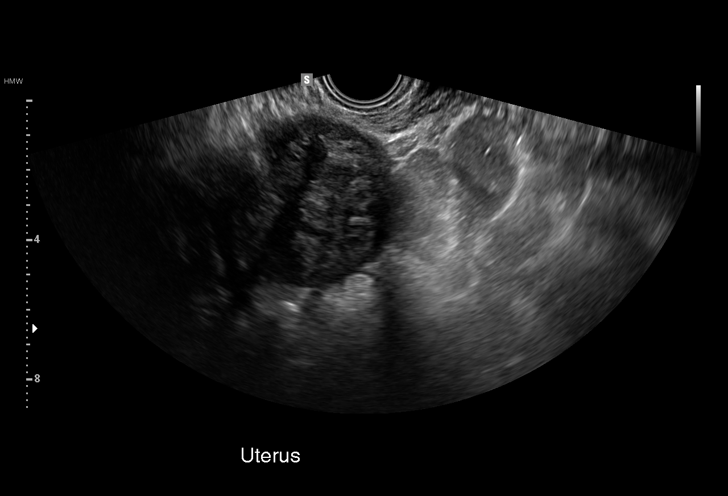
[im 63/69]
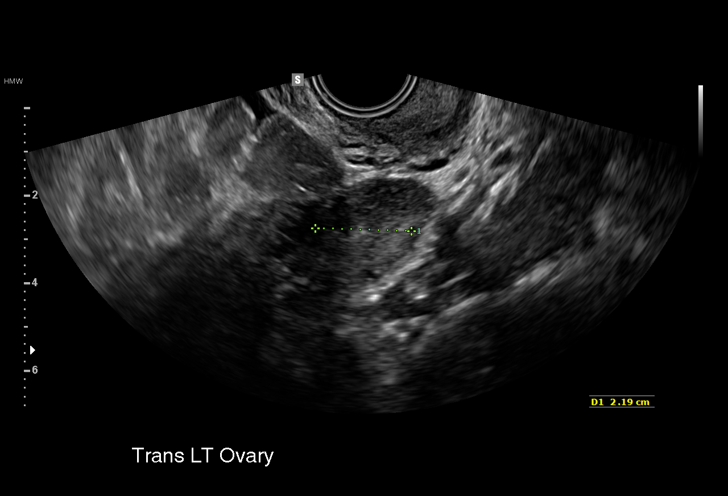
[im 69/69]
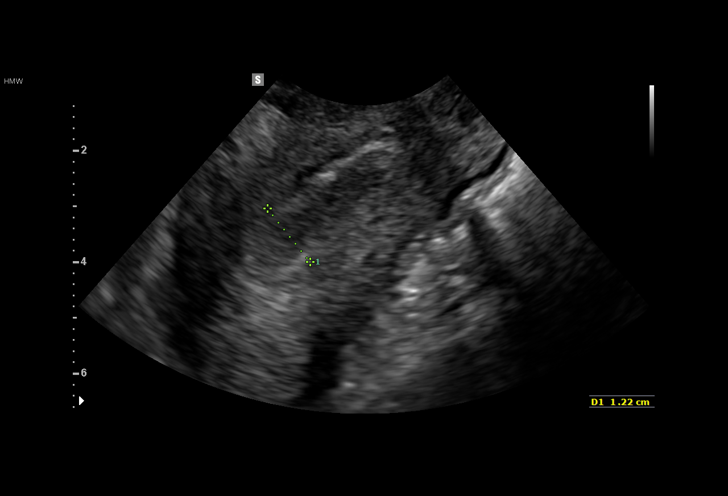

[15 of 28 positions shown; findings below may reference images not displayed]

FINDINGS: Intrauterine gestational sac: Not visualized.

Yolk sac:  Not visualized.

Embryo:  Not visualized.

Cardiac Activity: Not applicable.

MSD: Not applicable.

US EDC: Not applicable.

Subchorionic hemorrhage:  Not applicable.

Maternal uterus/adnexae: Endometrial thickening up to approximately
13 mm without evidence of an intrauterine gestational sac. Large
uterine fibroids, a broad-based subserosal fibroid arising from the
right lateral uterine body and right lateral fundus measuring
approximately 7.1 x 5.6 x 5.5 cm, and a subserosal fibroid arising
from the left uterine fundus measuring approximately 4.1 x 3.8 x
cm.

Normal-appearing ovaries bilaterally, the right measuring
approximately 3.3 x 2.3 x 2.3 cm and the left measuring
approximately 3.1 x 1.7 x 2.2 cm. No adnexal masses or free pelvic
fluid.
IMPRESSION: 1. No evidence of intrauterine pregnancy. With a positive
quantitative beta HCG, an ectopic is not entirely excluded, though
there is no visible adnexal mass or free pelvic fluid.
2. Large uterine fibroids.
3. Endometrial thickening up to approximately 13 mm.
4. Normal-appearing ovaries bilaterally.
Serial quantitative beta HCG determination and depending on the
quantitative beta HCG levels, a followup ultrasound in 1-2 weeks may
be helpful.

## 2018-04-05 NOTE — L&D Delivery Note (Addendum)
Patient: Danielle Cisneros MRN: [id number]  GBS status: negative, IAP given none  Patient is a 28 y.o. now [id number] s/p NSVD at [redacted]w[redacted]d, who was admitted for IOL for postdates. AROM 1h 74m prior to delivery with clear fluid.   Delivery Note At 6:24 AM a viable and healthy female was delivered via Vaginal, Spontaneous (Presentation: LOA; vertex).  APGAR: 9, 9; weight  pending.    Entered room as baby was being delivered by labor nurses. Per report, head delivered LOA. No nuchal cord present. Shoulder and body delivered in usual fashion. Infant with spontaneous cry, placed on mother's abdomen, dried and bulb suctioned. Cord clamped x 2 after 1-minute delay, and cut by family member. Cord blood drawn. Placenta remained retained even after 30 minutes of gentle cord traction. Pitocin held without improvement. OB Attending Dr. Harolyn Rutherford called to assist. Nitroglycerin administered and manual retraction attempted without success twice.  Patient taken to OR for successful D&C, see separate note. Placenta intact, 3 vessel cord. Sent to pathology. Patient received dose of Zosyn for antibiotic prophylaxis.  Perineum inspected and found to have 2nd degree laceration, which was repaired in usual fashion with good hemostasis achieved.  Anesthesia:  Epidural Episiotomy: None Lacerations:  2nd degree perineal Est. Blood Loss including D&C (mL): 1758cc  Mom to postpartum.  Baby to Couplet care / Skin to Skin.  [name] 01/09/2019, 7:28 AM

## 2018-06-09 ENCOUNTER — Other Ambulatory Visit (HOSPITAL_COMMUNITY): Payer: Self-pay | Admitting: Nurse Practitioner

## 2018-06-09 DIAGNOSIS — Z369 Encounter for antenatal screening, unspecified: Secondary | ICD-10-CM

## 2018-06-13 ENCOUNTER — Encounter (HOSPITAL_COMMUNITY): Payer: Self-pay | Admitting: *Deleted

## 2018-06-14 ENCOUNTER — Encounter (HOSPITAL_COMMUNITY): Payer: Self-pay | Admitting: *Deleted

## 2018-06-14 ENCOUNTER — Ambulatory Visit (HOSPITAL_COMMUNITY): Payer: BLUE CROSS/BLUE SHIELD | Admitting: *Deleted

## 2018-06-14 ENCOUNTER — Other Ambulatory Visit (HOSPITAL_COMMUNITY): Payer: Self-pay | Admitting: Nurse Practitioner

## 2018-06-14 ENCOUNTER — Ambulatory Visit (HOSPITAL_COMMUNITY): Payer: BLUE CROSS/BLUE SHIELD

## 2018-06-14 ENCOUNTER — Other Ambulatory Visit (HOSPITAL_COMMUNITY): Payer: Self-pay | Admitting: *Deleted

## 2018-06-14 ENCOUNTER — Ambulatory Visit (HOSPITAL_COMMUNITY)
Admission: RE | Admit: 2018-06-14 | Discharge: 2018-06-14 | Disposition: A | Discharge status: Home / Self Care | Payer: BLUE CROSS/BLUE SHIELD | Source: Ambulatory Visit | Attending: Obstetrics and Gynecology | Admitting: Obstetrics and Gynecology

## 2018-06-14 ENCOUNTER — Other Ambulatory Visit: Payer: Self-pay

## 2018-06-14 DIAGNOSIS — Z369 Encounter for antenatal screening, unspecified: Secondary | ICD-10-CM

## 2018-06-14 DIAGNOSIS — Z3687 Encounter for antenatal screening for uncertain dates: Secondary | ICD-10-CM | POA: Diagnosis not present

## 2018-06-14 DIAGNOSIS — O3411 Maternal care for benign tumor of corpus uteri, first trimester: Secondary | ICD-10-CM | POA: Diagnosis not present

## 2018-06-14 DIAGNOSIS — D259 Leiomyoma of uterus, unspecified: Secondary | ICD-10-CM | POA: Diagnosis not present

## 2018-06-14 DIAGNOSIS — Z3A11 11 weeks gestation of pregnancy: Secondary | ICD-10-CM

## 2018-06-14 DIAGNOSIS — O09211 Supervision of pregnancy with history of pre-term labor, first trimester: Secondary | ICD-10-CM | POA: Diagnosis not present

## 2018-06-19 ENCOUNTER — Encounter (HOSPITAL_COMMUNITY): Payer: Self-pay

## 2018-06-26 ENCOUNTER — Other Ambulatory Visit: Payer: Self-pay

## 2018-06-26 ENCOUNTER — Ambulatory Visit (HOSPITAL_COMMUNITY): Payer: BLUE CROSS/BLUE SHIELD | Admitting: *Deleted

## 2018-06-26 ENCOUNTER — Encounter (HOSPITAL_COMMUNITY): Payer: Self-pay

## 2018-06-26 ENCOUNTER — Ambulatory Visit (HOSPITAL_COMMUNITY)
Admission: RE | Admit: 2018-06-26 | Discharge: 2018-06-26 | Disposition: A | Discharge status: Home / Self Care | Payer: BLUE CROSS/BLUE SHIELD | Source: Ambulatory Visit | Attending: Obstetrics & Gynecology | Admitting: Obstetrics & Gynecology

## 2018-06-26 ENCOUNTER — Other Ambulatory Visit (HOSPITAL_COMMUNITY): Payer: BLUE CROSS/BLUE SHIELD

## 2018-06-26 VITALS — Temp 97.9°F

## 2018-06-26 DIAGNOSIS — O99211 Obesity complicating pregnancy, first trimester: Secondary | ICD-10-CM | POA: Diagnosis not present

## 2018-06-26 DIAGNOSIS — Z369 Encounter for antenatal screening, unspecified: Secondary | ICD-10-CM | POA: Insufficient documentation

## 2018-06-26 DIAGNOSIS — D259 Leiomyoma of uterus, unspecified: Secondary | ICD-10-CM

## 2018-06-26 DIAGNOSIS — Z3682 Encounter for antenatal screening for nuchal translucency: Secondary | ICD-10-CM | POA: Insufficient documentation

## 2018-06-26 DIAGNOSIS — O09211 Supervision of pregnancy with history of pre-term labor, first trimester: Secondary | ICD-10-CM

## 2018-06-26 DIAGNOSIS — O3411 Maternal care for benign tumor of corpus uteri, first trimester: Secondary | ICD-10-CM

## 2018-06-26 DIAGNOSIS — Z3A12 12 weeks gestation of pregnancy: Secondary | ICD-10-CM

## 2018-06-29 ENCOUNTER — Other Ambulatory Visit (HOSPITAL_COMMUNITY): Payer: Self-pay | Admitting: Obstetrics & Gynecology

## 2018-07-19 ENCOUNTER — Other Ambulatory Visit (HOSPITAL_COMMUNITY): Payer: Self-pay | Admitting: Family Medicine

## 2018-07-19 DIAGNOSIS — Z3A20 20 weeks gestation of pregnancy: Secondary | ICD-10-CM

## 2018-07-19 DIAGNOSIS — Z363 Encounter for antenatal screening for malformations: Secondary | ICD-10-CM

## 2018-07-19 DIAGNOSIS — O280 Abnormal hematological finding on antenatal screening of mother: Secondary | ICD-10-CM

## 2018-08-18 ENCOUNTER — Other Ambulatory Visit: Payer: Self-pay

## 2018-08-18 ENCOUNTER — Other Ambulatory Visit (HOSPITAL_COMMUNITY): Payer: Self-pay | Admitting: *Deleted

## 2018-08-18 ENCOUNTER — Encounter (HOSPITAL_COMMUNITY): Payer: Self-pay

## 2018-08-18 ENCOUNTER — Ambulatory Visit (HOSPITAL_COMMUNITY): Payer: BLUE CROSS/BLUE SHIELD | Admitting: *Deleted

## 2018-08-18 ENCOUNTER — Ambulatory Visit (HOSPITAL_COMMUNITY)
Admission: RE | Admit: 2018-08-18 | Discharge: 2018-08-18 | Disposition: A | Discharge status: Home / Self Care | Payer: BLUE CROSS/BLUE SHIELD | Source: Ambulatory Visit | Attending: Obstetrics and Gynecology | Admitting: Obstetrics and Gynecology

## 2018-08-18 VITALS — Temp 98.8°F

## 2018-08-18 DIAGNOSIS — Z362 Encounter for other antenatal screening follow-up: Secondary | ICD-10-CM

## 2018-08-18 DIAGNOSIS — O280 Abnormal hematological finding on antenatal screening of mother: Secondary | ICD-10-CM | POA: Insufficient documentation

## 2018-08-18 DIAGNOSIS — O0990 Supervision of high risk pregnancy, unspecified, unspecified trimester: Secondary | ICD-10-CM

## 2018-08-18 DIAGNOSIS — Z3A20 20 weeks gestation of pregnancy: Secondary | ICD-10-CM | POA: Diagnosis not present

## 2018-08-18 DIAGNOSIS — Z363 Encounter for antenatal screening for malformations: Secondary | ICD-10-CM | POA: Insufficient documentation

## 2018-09-15 ENCOUNTER — Ambulatory Visit (HOSPITAL_COMMUNITY)
Admission: RE | Admit: 2018-09-15 | Discharge: 2018-09-15 | Disposition: A | Discharge status: Home / Self Care | Payer: BC Managed Care – PPO | Source: Ambulatory Visit | Attending: Obstetrics and Gynecology | Admitting: Obstetrics and Gynecology

## 2018-09-15 ENCOUNTER — Ambulatory Visit (HOSPITAL_COMMUNITY): Payer: BC Managed Care – PPO | Admitting: *Deleted

## 2018-09-15 ENCOUNTER — Encounter (HOSPITAL_COMMUNITY): Payer: Self-pay

## 2018-09-15 ENCOUNTER — Other Ambulatory Visit: Payer: Self-pay

## 2018-09-15 ENCOUNTER — Other Ambulatory Visit (HOSPITAL_COMMUNITY): Payer: Self-pay | Admitting: *Deleted

## 2018-09-15 VITALS — BP 104/55 | HR 76 | Temp 98.8°F

## 2018-09-15 DIAGNOSIS — O09219 Supervision of pregnancy with history of pre-term labor, unspecified trimester: Secondary | ICD-10-CM

## 2018-09-15 DIAGNOSIS — O99212 Obesity complicating pregnancy, second trimester: Secondary | ICD-10-CM

## 2018-09-15 DIAGNOSIS — O0990 Supervision of high risk pregnancy, unspecified, unspecified trimester: Secondary | ICD-10-CM

## 2018-09-15 DIAGNOSIS — Z3A24 24 weeks gestation of pregnancy: Secondary | ICD-10-CM

## 2018-09-15 DIAGNOSIS — O99210 Obesity complicating pregnancy, unspecified trimester: Secondary | ICD-10-CM

## 2018-09-15 DIAGNOSIS — Z362 Encounter for other antenatal screening follow-up: Secondary | ICD-10-CM | POA: Diagnosis present

## 2018-09-15 DIAGNOSIS — O3410 Maternal care for benign tumor of corpus uteri, unspecified trimester: Secondary | ICD-10-CM

## 2018-10-13 ENCOUNTER — Other Ambulatory Visit (HOSPITAL_COMMUNITY): Payer: Self-pay | Admitting: *Deleted

## 2018-10-13 ENCOUNTER — Other Ambulatory Visit: Payer: Self-pay

## 2018-10-13 ENCOUNTER — Encounter (HOSPITAL_COMMUNITY): Payer: Self-pay

## 2018-10-13 ENCOUNTER — Ambulatory Visit (HOSPITAL_COMMUNITY): Payer: BC Managed Care – PPO | Admitting: *Deleted

## 2018-10-13 ENCOUNTER — Ambulatory Visit (HOSPITAL_COMMUNITY)
Admission: RE | Admit: 2018-10-13 | Discharge: 2018-10-13 | Disposition: A | Discharge status: Home / Self Care | Payer: BC Managed Care – PPO | Source: Ambulatory Visit | Attending: Maternal & Fetal Medicine | Admitting: Maternal & Fetal Medicine

## 2018-10-13 VITALS — BP 107/52 | HR 78 | Temp 98.5°F

## 2018-10-13 DIAGNOSIS — O0990 Supervision of high risk pregnancy, unspecified, unspecified trimester: Secondary | ICD-10-CM | POA: Diagnosis present

## 2018-10-13 DIAGNOSIS — O09213 Supervision of pregnancy with history of pre-term labor, third trimester: Secondary | ICD-10-CM | POA: Diagnosis not present

## 2018-10-13 DIAGNOSIS — O99213 Obesity complicating pregnancy, third trimester: Secondary | ICD-10-CM | POA: Diagnosis not present

## 2018-10-13 DIAGNOSIS — Z362 Encounter for other antenatal screening follow-up: Secondary | ICD-10-CM | POA: Diagnosis not present

## 2018-10-13 DIAGNOSIS — O99210 Obesity complicating pregnancy, unspecified trimester: Secondary | ICD-10-CM | POA: Diagnosis not present

## 2018-10-13 DIAGNOSIS — Z3A28 28 weeks gestation of pregnancy: Secondary | ICD-10-CM

## 2018-10-13 DIAGNOSIS — O3413 Maternal care for benign tumor of corpus uteri, third trimester: Secondary | ICD-10-CM

## 2018-10-24 ENCOUNTER — Encounter: Payer: Self-pay | Admitting: *Deleted

## 2018-10-24 NOTE — Progress Notes (Signed)
FMLA paperwork entered on wrong pt...erroneous encounter

## 2018-10-25 ENCOUNTER — Encounter: Payer: Self-pay | Admitting: *Deleted

## 2018-11-10 ENCOUNTER — Ambulatory Visit (HOSPITAL_COMMUNITY)
Admission: RE | Admit: 2018-11-10 | Discharge: 2018-11-10 | Disposition: A | Discharge status: Home / Self Care | Payer: Medicaid Other | Source: Ambulatory Visit | Attending: Obstetrics and Gynecology | Admitting: Obstetrics and Gynecology

## 2018-11-10 ENCOUNTER — Other Ambulatory Visit: Payer: Self-pay

## 2018-11-10 ENCOUNTER — Ambulatory Visit (HOSPITAL_COMMUNITY): Payer: Medicaid Other | Admitting: *Deleted

## 2018-11-10 ENCOUNTER — Other Ambulatory Visit (HOSPITAL_COMMUNITY): Payer: Self-pay | Admitting: *Deleted

## 2018-11-10 VITALS — BP 105/52 | HR 87 | Temp 98.6°F

## 2018-11-10 DIAGNOSIS — Z3689 Encounter for other specified antenatal screening: Secondary | ICD-10-CM

## 2018-11-10 DIAGNOSIS — O99213 Obesity complicating pregnancy, third trimester: Secondary | ICD-10-CM | POA: Diagnosis not present

## 2018-11-10 DIAGNOSIS — Z362 Encounter for other antenatal screening follow-up: Secondary | ICD-10-CM

## 2018-11-10 DIAGNOSIS — Z3A32 32 weeks gestation of pregnancy: Secondary | ICD-10-CM | POA: Diagnosis not present

## 2018-11-10 DIAGNOSIS — O09213 Supervision of pregnancy with history of pre-term labor, third trimester: Secondary | ICD-10-CM | POA: Diagnosis not present

## 2018-11-10 DIAGNOSIS — O99210 Obesity complicating pregnancy, unspecified trimester: Secondary | ICD-10-CM | POA: Diagnosis not present

## 2018-12-08 ENCOUNTER — Other Ambulatory Visit: Payer: Self-pay

## 2018-12-08 ENCOUNTER — Encounter (HOSPITAL_COMMUNITY): Payer: Self-pay

## 2018-12-08 ENCOUNTER — Ambulatory Visit (HOSPITAL_COMMUNITY): Payer: BC Managed Care – PPO | Admitting: *Deleted

## 2018-12-08 ENCOUNTER — Ambulatory Visit (HOSPITAL_COMMUNITY)
Admission: RE | Admit: 2018-12-08 | Discharge: 2018-12-08 | Disposition: A | Discharge status: Home / Self Care | Payer: BC Managed Care – PPO | Source: Ambulatory Visit | Attending: Obstetrics and Gynecology | Admitting: Obstetrics and Gynecology

## 2018-12-08 VITALS — BP 108/51 | HR 88 | Temp 99.0°F

## 2018-12-08 DIAGNOSIS — O3413 Maternal care for benign tumor of corpus uteri, third trimester: Secondary | ICD-10-CM

## 2018-12-08 DIAGNOSIS — O99213 Obesity complicating pregnancy, third trimester: Secondary | ICD-10-CM | POA: Diagnosis not present

## 2018-12-08 DIAGNOSIS — O0990 Supervision of high risk pregnancy, unspecified, unspecified trimester: Secondary | ICD-10-CM | POA: Diagnosis present

## 2018-12-08 DIAGNOSIS — Z362 Encounter for other antenatal screening follow-up: Secondary | ICD-10-CM

## 2018-12-08 DIAGNOSIS — O99210 Obesity complicating pregnancy, unspecified trimester: Secondary | ICD-10-CM | POA: Insufficient documentation

## 2018-12-08 DIAGNOSIS — O09213 Supervision of pregnancy with history of pre-term labor, third trimester: Secondary | ICD-10-CM

## 2018-12-08 DIAGNOSIS — Z3A36 36 weeks gestation of pregnancy: Secondary | ICD-10-CM

## 2018-12-30 IMAGING — US US MFM OB LIMITED
1 series · 14 of 28 positions shown · non-contrast
Comparison: none

[Series 1: us mfm ob limited · 38 acquisitions, 14 frames shown]
[im 2/38]
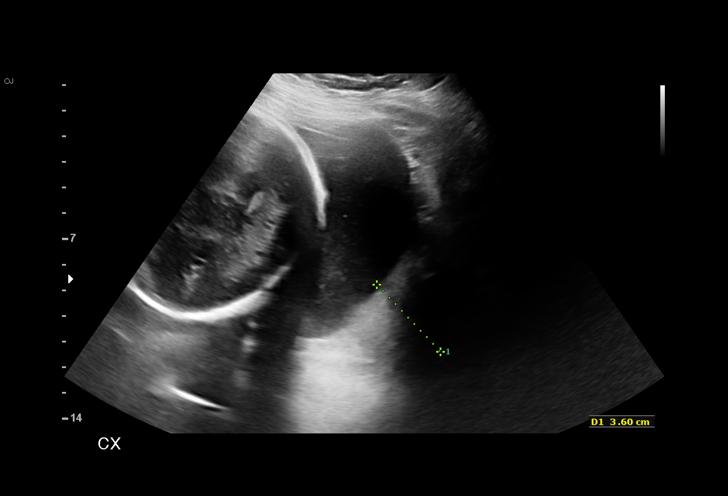
[im 5/38]
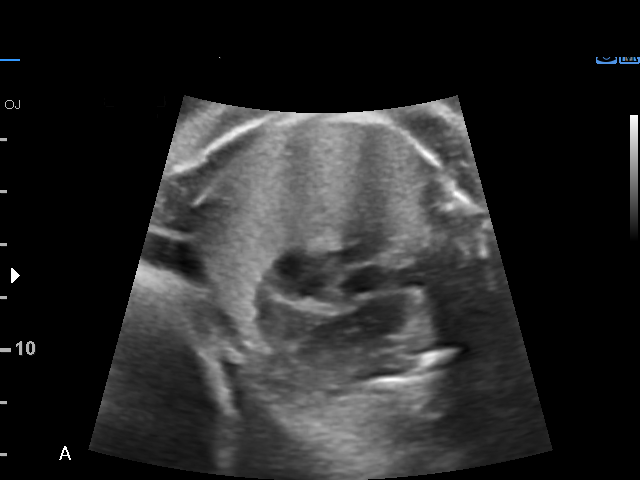
[im 7/38]
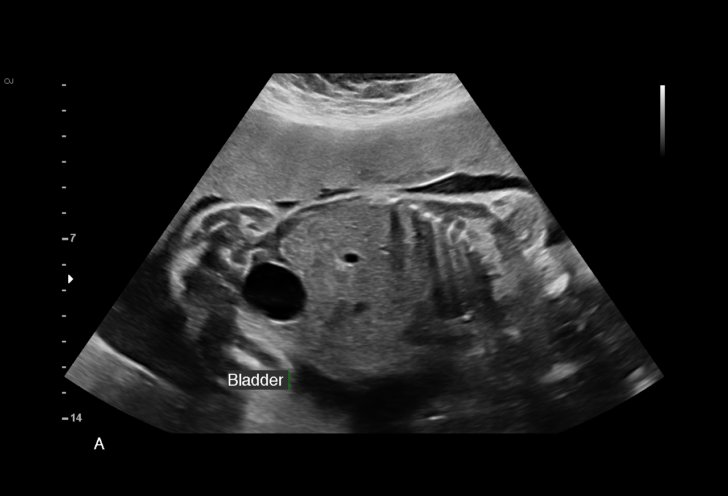
[im 10/38]
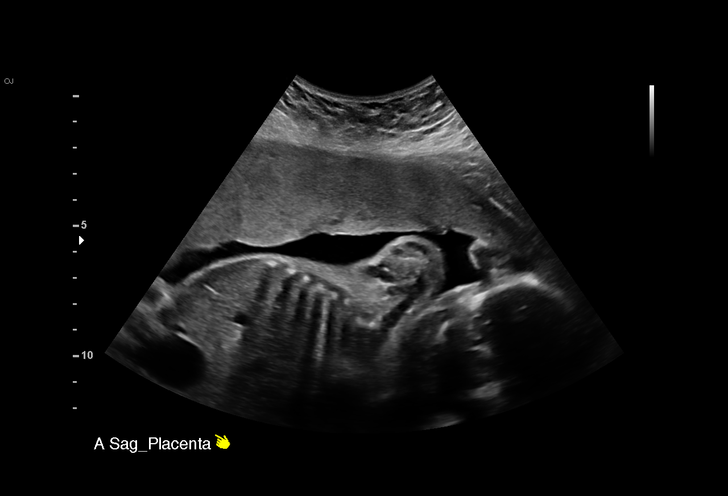
[im 13/38]
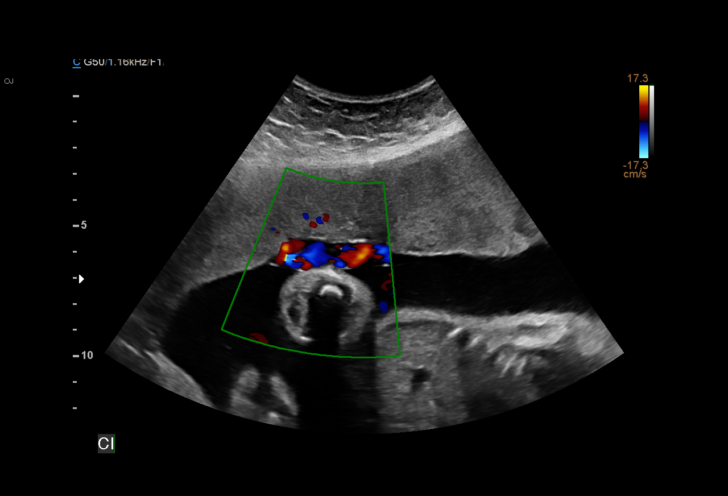
[im 16/38]
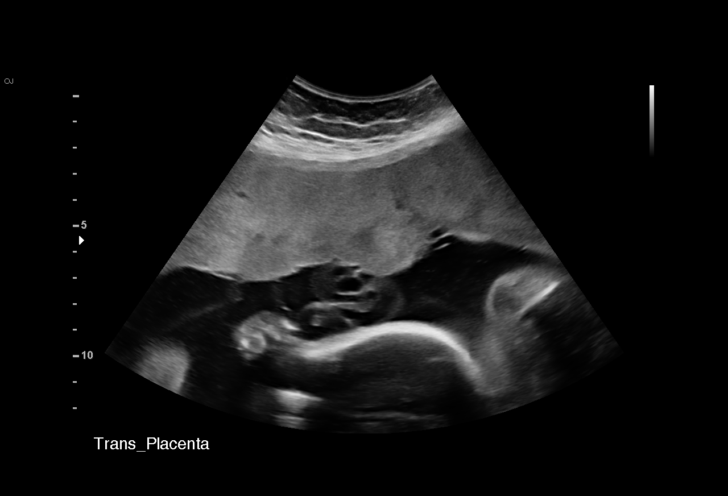
[im 18/38]
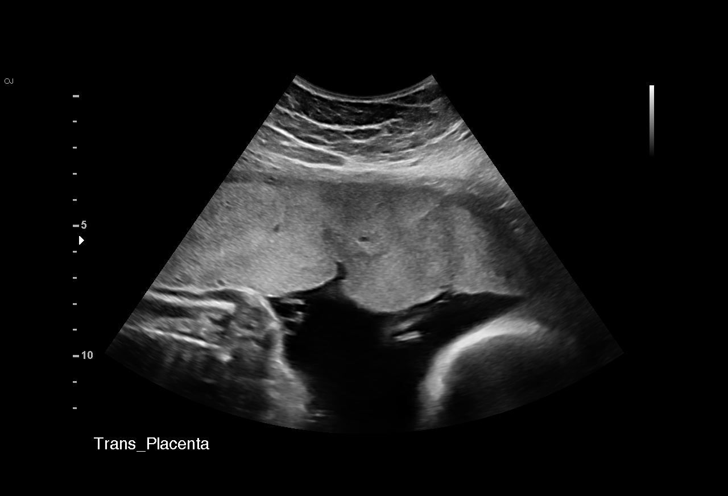
[im 21/38]
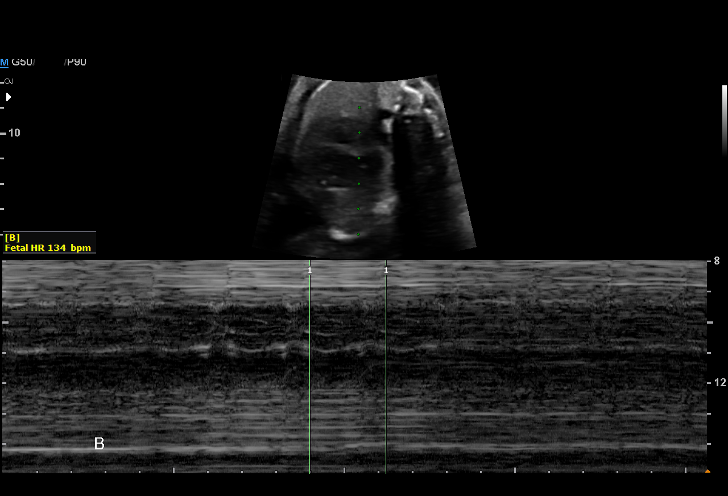
[im 24/38]
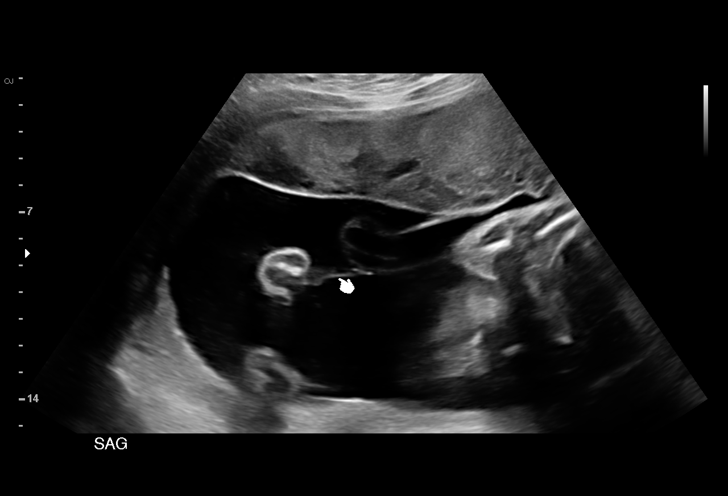
[im 27/38]
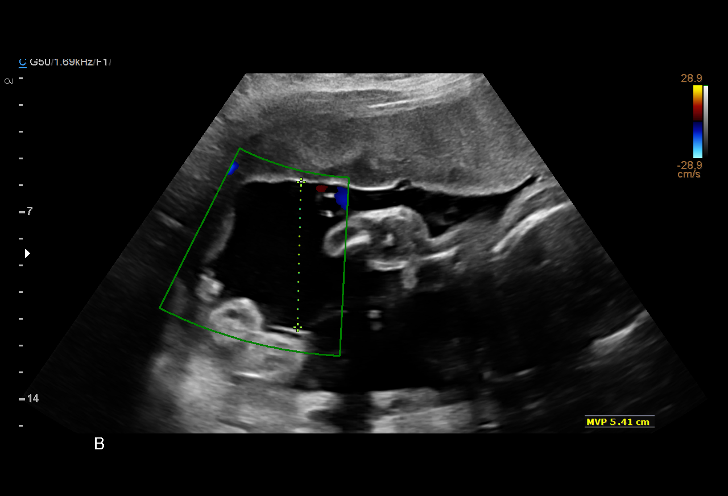
[im 29/38]
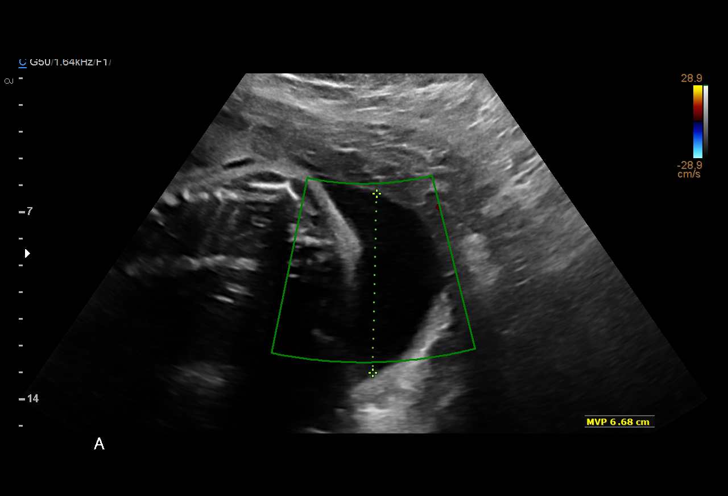
[im 32/38]
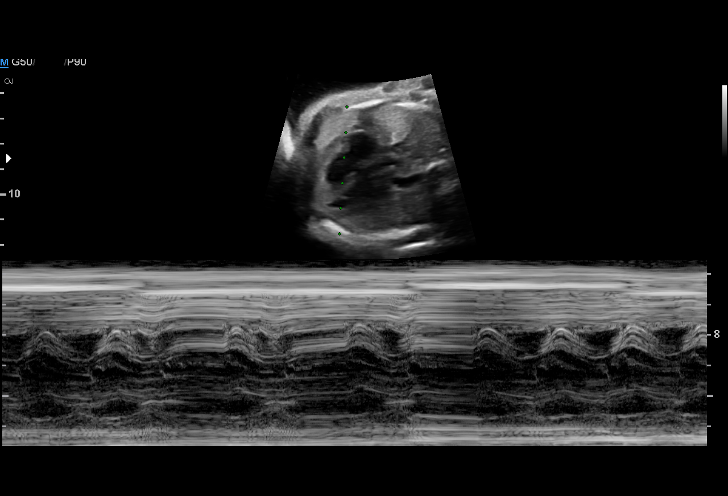
[im 35/38]
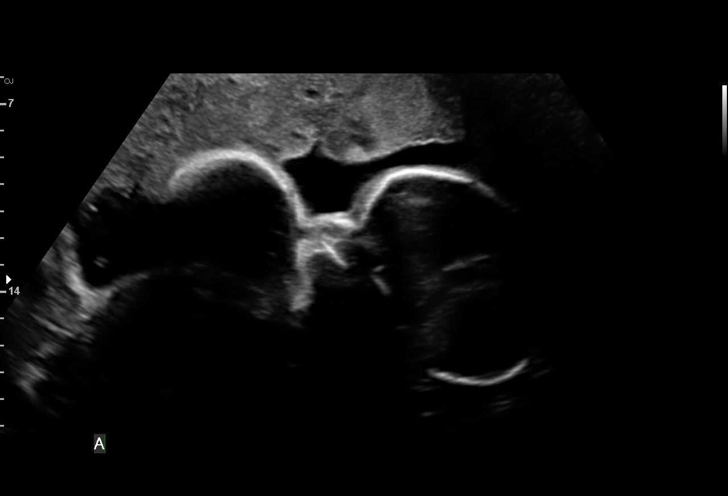
[im 38/38]
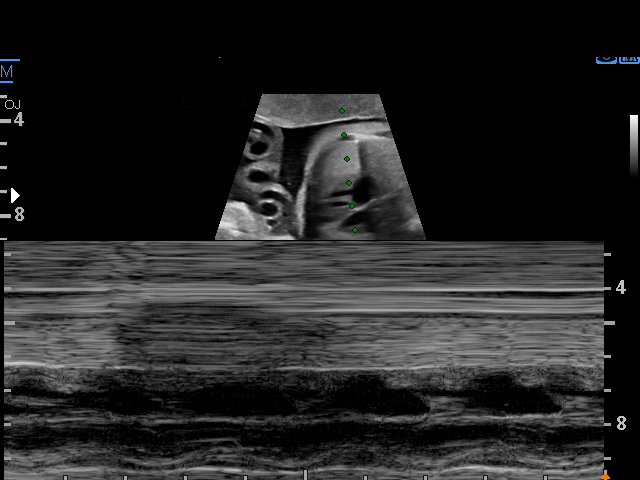

[14 of 28 positions shown; findings below may reference images not displayed]

([HOSPITAL])
[REDACTED] [HOSPITAL]

1  ITTOU PHA            [id number]      [id number]     [id number]
Indications

28 weeks gestation of pregnancy
Twin pregnancy, Chintan/Savai, second trimester
Obesity complicating pregnancy, second
trimester
Rh negative state in antepartum
OB History

Blood Type:            Height:  5'2"   Weight (lb):  224      BMI:
Gravidity:    2         Term:   0        Prem:   0        SAB:   1
TOP:          0       Ectopic:  0        Living: 0
Fetal Evaluation (Fetus A)

Num Of Fetuses:     2
Fetal Heart         135
Rate(bpm):
Cardiac Activity:   Arrhythmia noted
Fetal Lie:          Maternal left side
Presentation:       Cephalic
Placenta:           Anterior, above cervical os
P. Cord Insertion:  Visualized
Membrane Desc:      Dividing Membrane seen
Amniotic Fluid
AFI FV:      Subjectively within normal limits

Largest Pocket(cm)
6.68
Gestational Age (Fetus A)

LMP:           28w 0d       Date:   01/28/16                 EDD:   11/03/16
Clinical EDD:  28w 0d                                        EDD:   11/03/16
Best:          28w 0d    Det. By:   LMP  (01/28/16)          EDD:   11/03/16

Fetal Evaluation (Fetus B)

Num Of Fetuses:     2
Fetal Heart         134
Rate(bpm):
Cardiac Activity:   Observed
Fetal Lie:          Maternal Right side
Presentation:       Transverse, head to maternal left
Placenta:           Anterior, above cervical os
P. Cord Insertion:  Visualized
Membrane Desc:      Dividing Membrane seen

Amniotic Fluid
AFI FV:      Subjectively within normal limits

Largest Pocket(cm)
6.02
Gestational Age (Fetus B)

LMP:           28w 0d       Date:   01/28/16                 EDD:   11/03/16
Clinical EDD:  28w 0d                                        EDD:   11/03/16
Best:          28w 0d    Det. By:   LMP  (01/28/16)          EDD:   11/03/16
Cervix Uterus Adnexa

Cervix
Length:            3.6  cm.
Normal appearance by transabdominal scan.

Left Ovary
Not visualized. No adnexal mass visualized.

Right Ovary
Not visualized. No adnexal mass visualized.
Impression

Nieberl/Blade Aujla gestation at 28 weeks 0 days gestation with fetal
cardiac activity x2

Twin A:
Maternal left, Cephalic, Anterior placenta
Fetal bladder visualized
Normal amniotic fluid volume (MVP 6.7 cm)

Twin B:
Maternal right, Transverse, Anterior placenta
Fetal bladder visualized
Normal amniotic fluid volume (MVP 6.0 cm)

Myomas again noted, unchanged.
No evidence of TTTS
Recommendations

Limited ultrasound to screen for TTTS in 2 weeks
Ultrasound for serially for growth

## 2019-01-04 ENCOUNTER — Other Ambulatory Visit: Payer: Self-pay

## 2019-01-04 ENCOUNTER — Inpatient Hospital Stay (HOSPITAL_COMMUNITY)
Admission: AD | Admit: 2019-01-04 | Discharge: 2019-01-04 | Disposition: A | Discharge status: Home / Self Care | Payer: BC Managed Care – PPO | Attending: Obstetrics & Gynecology | Admitting: Obstetrics & Gynecology

## 2019-01-04 DIAGNOSIS — Z3689 Encounter for other specified antenatal screening: Secondary | ICD-10-CM

## 2019-01-04 DIAGNOSIS — O480 Post-term pregnancy: Secondary | ICD-10-CM | POA: Insufficient documentation

## 2019-01-04 DIAGNOSIS — Z3A40 40 weeks gestation of pregnancy: Secondary | ICD-10-CM | POA: Insufficient documentation

## 2019-01-04 NOTE — MAU Provider Note (Signed)
S: Ms. Danielle Cisneros is a 28 y.o. [phone] at [redacted]w[redacted]d  who presents to MAU today for NST for post dates as the [facility] is unable to do her NST today.   Cervical exam by RN: deferrred    Fetal Monitoring: Baseline: 120 Variability: mod Accelerations: present Decelerations: negative Contractions: occasional  MDM Discussed patient with RN. NST reviewed.   A: SIUP at [redacted]w[redacted]d  NST reassuring  P: NST has moderate variability, has present 15 x 15 Discharge home Return for IOL on 10-5 Labor precautions and kick counts included in AVS Patient may return to MAU as needed or when in labor   [place], [place] 01/04/2019 1:03 PM

## 2019-01-04 NOTE — Discharge Instructions (Signed)

## 2019-01-04 NOTE — MAU Note (Signed)
NST, set from HD.  The nurse that does the NST's there is out sick.  Pt is post dates.  Denies pain, bleeding or leaking.

## 2019-01-06 ENCOUNTER — Other Ambulatory Visit: Payer: Self-pay

## 2019-01-06 ENCOUNTER — Other Ambulatory Visit (HOSPITAL_COMMUNITY)
Admission: RE | Admit: 2019-01-06 | Discharge: 2019-01-06 | Disposition: A | Discharge status: Home / Self Care | Payer: BC Managed Care – PPO | Source: Ambulatory Visit | Attending: Obstetrics & Gynecology | Admitting: Obstetrics & Gynecology

## 2019-01-06 DIAGNOSIS — Z01812 Encounter for preprocedural laboratory examination: Secondary | ICD-10-CM | POA: Insufficient documentation

## 2019-01-06 DIAGNOSIS — Z20828 Contact with and (suspected) exposure to other viral communicable diseases: Secondary | ICD-10-CM | POA: Diagnosis not present

## 2019-01-06 LAB — SARS CORONAVIRUS 2 (TAT 6-24 HRS): SARS Coronavirus 2: NEGATIVE

## 2019-01-06 NOTE — MAU Note (Signed)
Pt here for PAT covid swab. Denies symptoms. Swab collected.  

## 2019-01-07 ENCOUNTER — Other Ambulatory Visit: Payer: Self-pay | Admitting: Family Medicine

## 2019-01-07 NOTE — Progress Notes (Deleted)
LABOR AND DELIVERY ADMISSION HISTORY AND PHYSICAL NOTE  Danielle Cisneros is a 28 y.o. female [phone] with IUP at [redacted]w[redacted]d by ultrasound presenting for IOL for postdates.  She reports positive fetal movement. She denies leakage of fluid or vaginal bleeding.  Prenatal History/Complications: PNC at [facility] Pregnancy complications:  - Rh negative s/p Rhogam at 28w, Antibody screen negative - History of preterm labor at [redacted]w[redacted]d with multiple gestation (twins) in 2018  - Hemoglobinopathy A+ variant - Large intramural myoma - Elevated AFP - normal anatomy scan  Past Medical History: Past Medical History:  Diagnosis Date  . Fibroid     Past Surgical History: Past Surgical History:  Procedure Laterality Date  . NO PAST SURGERIES      Obstetrical History: OB History    Gravida  3   Para  1   Term  0   Preterm  1   AB  1   Living  2     SAB  0   TAB      Ectopic  1   Multiple  1   Live Births  2        Obstetric Comments  SAB July 2017.         Social History: Social History   Socioeconomic History  . Marital status: Married    Spouse name: n/a  . Number of children: 0  . Years of education: Not on file  . Highest education level: Not on file  Occupational History  . Occupation: [employer]    Employer: [employer]  . Occupation: Product manager  Social Needs  . Financial resource strain: Not on file  . Food insecurity    Worry: Not on file    Inability: Not on file  . Transportation needs    Medical: Not on file    Non-medical: Not on file  Tobacco Use  . Smoking status: Never Smoker  . Smokeless tobacco: Never Used  Substance and Sexual Activity  . Alcohol use: No  . Drug use: No  . Sexual activity: Yes    Partners: Male  Lifestyle  . Physical activity    Days per week: Not on file    Minutes per session: Not on file  . Stress: Not on file  Relationships  . Social Herbalist on phone: Not on file    Gets together: Not on file     Attends religious service: Not on file    Active member of club or organization: Not on file    Attends meetings of clubs or organizations: Not on file    Relationship status: Not on file  Other Topics Concern  . Not on file  Social History Narrative   Lives with her father.   Born in [place].  Came to the [place] at age 52.    Family History: Family History  Problem Relation Age of Onset  . Hypertension Mother   . Hypertension Father   . Diabetes Father     Allergies: No Known Allergies  (Not in a hospital admission)    Review of Systems  All systems reviewed and negative except as stated in HPI  Physical Exam Last menstrual period 03/10/2018, currently breastfeeding. General appearance: alert, oriented, NAD*** Lungs: normal respiratory effort Heart: regular rate Abdomen: soft, non-tender; gravid, FH appropriate for GA Extremities: No calf swelling or tenderness Presentation: cephalic*** Fetal monitoring: *** Uterine activity: ***    Prenatal labs: ABO, Rh:   O negative  Antibody:  Negative Rubella:   Immune RPR:    Negative HBsAg:    Negative HIV:   Nonreactive GC/Chlamydia: Negative GBS:   Negative on 9/3 2-hr GTT: WNL Genetic screening:  Elevated AFP - normal anatomy scan Anatomy US: Normal  Prenatal Transfer Tool  Maternal Diabetes: No Genetic Screening: Abnormal:  Results: Elevated AFP Maternal Ultrasounds/Referrals: Normal Fetal Ultrasounds or other Referrals:  None Maternal Substance Abuse:  No Significant Maternal Medications:  None Significant Maternal Lab Results: Rh negative  No results found for this or any previous visit (from the past 24 hour(s)).  Patient Active Problem List   Diagnosis Date Noted  . Encounter for initial prescription of intrauterine contraceptive device (IUD) 11/15/2016  . Large uterus 11/15/2016  . Uterine fibroid complicating antenatal care, baby not yet delivered in second trimester 06/04/2016  . Morbid obesity  ([place]) 07/17/2015    Assessment: Danielle Cisneros is a 28 y.o. [id number] at [redacted]w[redacted]d here for IOL for PD  #Labor: *** #Pain: *** #FWB: *** #ID:  GBS neg on 9/3, ***  Postpartum Planning: - Girl / *** / IUD vs Nexplanon - [x]  Flu, [x]  TDAP  Danielle Cisneros 01/07/2019, 10:44 PM

## 2019-01-08 ENCOUNTER — Encounter (HOSPITAL_COMMUNITY): Payer: Self-pay

## 2019-01-08 ENCOUNTER — Inpatient Hospital Stay (HOSPITAL_COMMUNITY)
Admission: AD | Admit: 2019-01-08 | Discharge: 2019-01-11 | DRG: 797 | Disposition: A | Discharge status: Home / Self Care | Payer: BC Managed Care – PPO | Attending: Obstetrics & Gynecology | Admitting: Obstetrics & Gynecology

## 2019-01-08 ENCOUNTER — Inpatient Hospital Stay (HOSPITAL_COMMUNITY): Payer: BC Managed Care – PPO

## 2019-01-08 ENCOUNTER — Other Ambulatory Visit: Payer: Self-pay

## 2019-01-08 DIAGNOSIS — Z3A40 40 weeks gestation of pregnancy: Secondary | ICD-10-CM | POA: Diagnosis not present

## 2019-01-08 DIAGNOSIS — O3413 Maternal care for benign tumor of corpus uteri, third trimester: Secondary | ICD-10-CM | POA: Diagnosis present

## 2019-01-08 DIAGNOSIS — O99214 Obesity complicating childbirth: Secondary | ICD-10-CM | POA: Diagnosis present

## 2019-01-08 DIAGNOSIS — O26893 Other specified pregnancy related conditions, third trimester: Secondary | ICD-10-CM | POA: Diagnosis present

## 2019-01-08 DIAGNOSIS — O9081 Anemia of the puerperium: Secondary | ICD-10-CM | POA: Diagnosis not present

## 2019-01-08 DIAGNOSIS — D259 Leiomyoma of uterus, unspecified: Secondary | ICD-10-CM | POA: Diagnosis present

## 2019-01-08 DIAGNOSIS — O3410 Maternal care for benign tumor of corpus uteri, unspecified trimester: Secondary | ICD-10-CM | POA: Diagnosis present

## 2019-01-08 DIAGNOSIS — O480 Post-term pregnancy: Secondary | ICD-10-CM | POA: Diagnosis present

## 2019-01-08 DIAGNOSIS — Z6791 Unspecified blood type, Rh negative: Secondary | ICD-10-CM | POA: Diagnosis not present

## 2019-01-08 DIAGNOSIS — D62 Acute posthemorrhagic anemia: Secondary | ICD-10-CM | POA: Diagnosis not present

## 2019-01-08 LAB — CBC
HCT: 35.9 % — ABNORMAL LOW (ref 36.0–46.0)
Hemoglobin: 11.3 g/dL — ABNORMAL LOW (ref 12.0–15.0)
MCH: 25.6 pg — ABNORMAL LOW (ref 26.0–34.0)
MCHC: 31.5 g/dL (ref 30.0–36.0)
MCV: 81.4 fL (ref 80.0–100.0)
Platelets: 284 10*3/uL (ref 150–400)
RBC: 4.41 MIL/uL (ref 3.87–5.11)
RDW: 15.8 % — ABNORMAL HIGH (ref 11.5–15.5)
WBC: 9.6 10*3/uL (ref 4.0–10.5)
nRBC: 0 % (ref 0.0–0.2)

## 2019-01-08 MED ORDER — ONDANSETRON HCL 4 MG/2ML IJ SOLN
4.0000 mg | Freq: Four times a day (QID) | INTRAMUSCULAR | Status: DC | PRN
  Administered 2019-01-09: 06:00:00 4 mg via INTRAVENOUS
  Filled 2019-01-08: qty 2

## 2019-01-08 MED ORDER — OXYTOCIN BOLUS FROM INFUSION
500.0000 mL | Freq: Once | INTRAVENOUS | Status: AC
  Administered 2019-01-09: 500 mL via INTRAVENOUS

## 2019-01-08 MED ORDER — MISOPROSTOL 25 MCG QUARTER TABLET: 25.0000 ug | ORAL_TABLET | ORAL | Status: DC | PRN

## 2019-01-08 MED ORDER — SOD CITRATE-CITRIC ACID 500-334 MG/5ML PO SOLN
30.0000 mL | ORAL | Status: DC | PRN
  Administered 2019-01-09: 07:00:00 30 mL via ORAL
  Filled 2019-01-08: qty 30

## 2019-01-08 MED ORDER — TERBUTALINE SULFATE 1 MG/ML IJ SOLN: 0.2500 mg | Freq: Once | INTRAMUSCULAR | Status: DC | PRN

## 2019-01-08 MED ORDER — ACETAMINOPHEN 325 MG PO TABS: 650.0000 mg | ORAL_TABLET | ORAL | Status: DC | PRN

## 2019-01-08 MED ORDER — LACTATED RINGERS IV SOLN
INTRAVENOUS | Status: DC
  Administered 2019-01-09 (×3): via INTRAVENOUS

## 2019-01-08 MED ORDER — OXYTOCIN 40 UNITS IN NORMAL SALINE INFUSION - SIMPLE MED
1.0000 m[IU]/min | INTRAVENOUS | Status: DC
  Administered 2019-01-08: 11:00:00 2 m[IU]/min via INTRAVENOUS

## 2019-01-08 MED ORDER — LACTATED RINGERS IV SOLN
500.0000 mL | INTRAVENOUS | Status: DC | PRN
  Administered 2019-01-09: 07:00:00 500 mL via INTRAVENOUS

## 2019-01-08 MED ORDER — OXYTOCIN 40 UNITS IN NORMAL SALINE INFUSION - SIMPLE MED
2.5000 [IU]/h | INTRAVENOUS | Status: DC
  Filled 2019-01-08: qty 1000

## 2019-01-08 MED ORDER — FENTANYL CITRATE (PF) 100 MCG/2ML IJ SOLN
50.0000 ug | INTRAMUSCULAR | Status: DC | PRN
  Administered 2019-01-08 – 2019-01-09 (×3): 100 ug via INTRAVENOUS
  Filled 2019-01-08 (×3): qty 2

## 2019-01-08 MED ORDER — LIDOCAINE HCL (PF) 1 % IJ SOLN: 30.0000 mL | INTRAMUSCULAR | Status: DC | PRN

## 2019-01-08 NOTE — Progress Notes (Signed)
LABOR PROGRESS NOTE  Danielle Cisneros is a 28 y.o. [id number] at [redacted]w[redacted]d  admitted for IOL for postdates  Subjective: Patient doing well. Starting to feel more contractions.   Objective: BP (!) 91/56   Pulse 80   Temp 98.7 F (37.1 C) (Oral)   Resp 18   Ht 5\' 2"  (1.575 m)   Wt 120.2 kg   LMP 03/10/2018 (Approximate)   BMI 48.47 kg/m  or  Vitals:   01/08/19 1943 01/08/19 2040 01/08/19 2102 01/08/19 2132  BP: 103/66 (!) 93/44 (!) 91/40 (!) 91/56  Pulse: 74 79 80 80  Resp: 18 18 18 18   Temp:      TempSrc:      Weight:      Height:        Dilation: 3.5 Effacement (%): 50 Cervical Position: Posterior Station: -3 Presentation: Vertex Exam by:: amber pope, rn  FHT: baseline rate 130, moderate varibility, + acel, no decel Toco: 1-4 mins  Labs: Lab Results  Component Value Date   WBC 9.6 01/08/2019   HGB 11.3 (L) 01/08/2019   HCT 35.9 (L) 01/08/2019   MCV 81.4 01/08/2019   PLT 284 01/08/2019    Patient Active Problem List   Diagnosis Date Noted  . Post-dates pregnancy 01/08/2019  . Uterine fibroid complicating antenatal care, baby not yet delivered in second trimester 06/04/2016  . Rh negative state in antepartum period 04/13/2016  . Morbid obesity ([place]) 07/17/2015    Assessment / Plan: 28 y.o. [id number] at [redacted]w[redacted]d here for IOL for post dates  Labor: S/p FB. Minimal cervical change. Cont Pit until 1200. Will reassess. If still similar cervical exam will stop PIT and give another Cytotec.  Fetal Wellbeing:  Cat I Pain Control:  Desires epidural Anticipated MOD:  NSVD  Mina Marble, D.O. [place], PGY2 01/08/2019, 9:48 PM

## 2019-01-08 NOTE — Progress Notes (Addendum)
LABOR PROGRESS NOTE  Danielle Cisneros is a 28 y.o. [id number] at [redacted]w[redacted]d  admitted for induction of labor postdates  Subjective: Patient doing well.  She is frustrated that she is no longer allowed to eat solid foods.  She must stick to clear liquids.  She reports she is feeling cramping but says she does not think the Foley bulb is fallen out yet.  Objective: BP (!) 89/54   Pulse 76   Resp 18   Ht 5\' 2"  (1.575 m)   Wt 120.2 kg   LMP 03/10/2018 (Approximate)   BMI 48.47 kg/m  or  Vitals:   01/08/19 1335 01/08/19 1402 01/08/19 1433 01/08/19 1435  BP: (!) 88/47 (!) 109/59 (!) 81/39 (!) 89/54  Pulse: 84 74 76 76  Resp: 18 18 18    Weight:      Height:      General: Well-appearing, sitting up in bed Dilation: 1.5 Effacement (%): 50 Cervical Position: Posterior Station: -3 Presentation: Vertex Exam by:: Milinda Cave CNM FHT: baseline rate 120, moderate varibility, no acel, no decel Toco: Contractions every 6 to 8 minutes  Labs: Lab Results  Component Value Date   WBC 9.6 01/08/2019   HGB 11.3 (L) 01/08/2019   HCT 35.9 (L) 01/08/2019   MCV 81.4 01/08/2019   PLT 284 01/08/2019    Patient Active Problem List   Diagnosis Date Noted  . Post-dates pregnancy 01/08/2019  . Uterine fibroid complicating antenatal care, baby not yet delivered in second trimester 06/04/2016  . Morbid obesity ([place]) 07/17/2015    Assessment / Plan: 28 y.o. [id number] at [redacted]w[redacted]d here for induction of labor  Labor: Progressing well.  Foley bulb in place. Pitocin was at 4, but in light of her recent meal we will bump down to 1. Fetal Wellbeing: Category 1, reassuring Pain Control: Epidural when requested Anticipated MOD: Vaginal delivery, C-section as appropriate  Gifford Shave, MD  PGY-1, [facility]  01/08/2019, 3:03 PM  GME ATTESTATION:  I evaluated the patient. I agree with the findings and the plan of care as documented in the resident's note.  Merilyn Baba, DO OB Fellow, Faculty  Practice 01/08/2019 3:13 PM

## 2019-01-08 NOTE — Progress Notes (Signed)
Patient eating tomato based soup when nurse entered room. Reminded patient of clear liquid diet order. Patient stated she was hungry. Clear liquid options explained to patient again. Patient continued eating. Gavin Pound informed of situation.

## 2019-01-08 NOTE — H&P (Addendum)
LABOR AND DELIVERY ADMISSION HISTORY AND PHYSICAL NOTE  Danielle Cisneros is a 28 y.o. female [phone] with IUP at [redacted]w[redacted]d by LMP c/w 28 [place] presenting for induction of labor due to postdates.   She reports positive fetal movement. She denies leakage of fluid or vaginal bleeding.  Patient reports she is doing well no concerns at this time.  Prenatal History/Complications: PNC at [facility] Pregnancy complications:  - Rh negative s/p Rhogam at 28w, Antibody screen negative - History of preterm labor at [redacted]w[redacted]d with multiple gestation (twins) in 2018  - Hemoglobinopathy A+ variant - Large intramural myoma - Elevated AFP - normal anatomy scan  Past Medical History: Past Medical History:  Diagnosis Date  . Fibroid     Past Surgical History: Past Surgical History:  Procedure Laterality Date  . NO PAST SURGERIES      Obstetrical History: OB History    Gravida  3   Para  1   Term  0   Preterm  1   AB  1   Living  2     SAB  0   TAB      Ectopic  1   Multiple  1   Live Births  2        Obstetric Comments  SAB July 2017.         Social History: Social History   Socioeconomic History  . Marital status: Married    Spouse name: n/a  . Number of children: 0  . Years of education: Not on file  . Highest education level: Not on file  Occupational History  . Occupation: [employer]    Employer: [employer]  . Occupation: Product manager  Social Needs  . Financial resource strain: Not on file  . Food insecurity    Worry: Not on file    Inability: Not on file  . Transportation needs    Medical: Not on file    Non-medical: Not on file  Tobacco Use  . Smoking status: Never Smoker  . Smokeless tobacco: Never Used  Substance and Sexual Activity  . Alcohol use: No  . Drug use: No  . Sexual activity: Yes    Partners: Male  Lifestyle  . Physical activity    Days per week: Not on file    Minutes per session: Not on file  . Stress: Not on file  Relationships   . Social Herbalist on phone: Not on file    Gets together: Not on file    Attends religious service: Not on file    Active member of club or organization: Not on file    Attends meetings of clubs or organizations: Not on file    Relationship status: Not on file  Other Topics Concern  . Not on file  Social History Narrative   Lives with her father.   Born in [place].  Came to the [place] at age 33.    Family History: Family History  Problem Relation Age of Onset  . Hypertension Mother   . Hypertension Father   . Diabetes Father     Allergies: No Known Allergies  Medications Prior to Admission  Medication Sig Dispense Refill Last Dose  . Acetaminophen (TYLENOL PO) Take by mouth.     . docusate sodium (COLACE) 100 MG capsule Take 100 mg by mouth 2 (two) times daily.     . Prenat-FeAsp-Meth-FA-DHA w/o A (PRENATE PIXIE) 10-0.6-0.4-200 MG CAPS Take 1 tablet by mouth daily. 30 capsule 12   .  prenatal vitamin w/FE, FA (NATACHEW) 29-1 MG CHEW chewable tablet Chew 1 tablet by mouth daily at 12 noon. (Patient not taking: Reported on 06/26/2018) 30 tablet 12      Review of Systems   All systems reviewed and negative except as stated in HPI  Blood pressure 103/63, pulse 87, height 5\' 2"  (1.575 m), weight 120.2 kg, last menstrual period 03/10/2018, currently breastfeeding. General appearance: alert and cooperative Lungs: clear to auscultation bilaterally Heart: regular rate and rhythm Abdomen: soft, non-tender; bowel sounds normal Extremities: No calf swelling or tenderness Presentation: cephalic Fetal monitoring: Category 1, moderate variability, baseline fetal heart rate in the 120s, A cells, no decelerations Uterine activity:Irregular contractions Dilation: 1.5 Effacement (%): 50 Station: -3 Exam by:: Milinda Cave CNM  Ultrasound 9/05-2018 EFW- 2988 g   Prenatal labs: ABO, Rh: --/--/PENDING (10/05 1003) O- Antibody: PENDING (10/05 1003) antibody negative Rubella:   Immune RPR:   Negative HBsAg:   Negative HIV:   Nonreactive GBS:   Negative 1 hr Glucola: Negative, 101 Genetic screening: Borderline elevated AFP with normal anatomy scan, negative for all other genetic screening Anatomy US: Normal intrauterine growth  Prenatal Transfer Tool  Maternal Diabetes: No Genetic Screening: Abnormal:  Results: Other: Elevated AFP Maternal Ultrasounds/Referrals: Normal Fetal Ultrasounds or other Referrals:  None Maternal Substance Abuse:  No Significant Maternal Medications:  None Significant Maternal Lab Results: Rh negative RhoGam given 10/23/18  Results for orders placed or performed during the hospital encounter of 01/08/19 (from the past 24 hour(s))  CBC   Collection Time: 01/08/19 10:03 AM  Result Value Ref Range   WBC 9.6 4.0 - 10.5 K/uL   RBC 4.41 3.87 - 5.11 MIL/uL   Hemoglobin 11.3 (L) 12.0 - 15.0 g/dL   HCT 35.9 (L) 36.0 - 46.0 %   MCV 81.4 80.0 - 100.0 fL   MCH 25.6 (L) 26.0 - 34.0 pg   MCHC 31.5 30.0 - 36.0 g/dL   RDW 15.8 (H) 11.5 - 15.5 %   Platelets 284 150 - 400 K/uL   nRBC 0.0 0.0 - 0.2 %  Type and screen [place]   Collection Time: 01/08/19 10:03 AM  Result Value Ref Range   ABO/RH(D) PENDING    Antibody Screen PENDING    Sample Expiration      01/11/2019,2359 Performed at [facility], [address], [place], [place] [address]     Patient Active Problem List   Diagnosis Date Noted  . Post-dates pregnancy 01/08/2019  . Encounter for initial prescription of intrauterine contraceptive device (IUD) 11/15/2016  . Large uterus 11/15/2016  . Uterine fibroid complicating antenatal care, baby not yet delivered in second trimester 06/04/2016  . Morbid obesity ([place]) 07/17/2015    Assessment: Danielle Cisneros is a 28 y.o. [id number] at [redacted]w[redacted]d here for induction of labor due to postdates  #Labor: Not feeling contractions at this time, Foley bulb placed at approximately 1100.  Will reevaluate when Foley bulb  falls out #Pain: No pain control at this time, planning on epidural eventually #FWB: Reassuring, #ID: GBS negative 9/3  Postpartum planning Female/breast/IUD - [x]  Flu, [x]  TDAP Gifford Shave 01/08/2019, 11:41 AM   I confirm that I have verified the information documented in the resident's note and that I have also personally performed the history, physical exam and all medical decision making activities of this service and have verified that all service and findings are accurately documented in this student's note.   -Discussed r/b of induction including fetal  distress, serial induction, pain, and increased risk of c/s delivery -Discussed induction methods including cervical ripening agents, foley bulbs, and pitocin -Patient verbalizes understanding and wishes to proceed with induction process -Discussed foley bulb insertion including r/b, placement, associated discomfort, initiation of pitocin, and removal. -Foley bulb placed by provider, via speculum, without difficulty. Patient tolerated procedure well. -Will start pitocin at 75mUn/min and increase to 34mUn/min and hold.  -Patient educated on availability of medications for pain management during latency phase. -Patient without questions or concerns.  -Will continue to monitor and reassess as needed.  -Cat I FT overall.  Gavin Pound, CNM 01/08/2019 1:16 PM

## 2019-01-09 ENCOUNTER — Inpatient Hospital Stay (HOSPITAL_COMMUNITY): Payer: BC Managed Care – PPO | Admitting: Anesthesiology

## 2019-01-09 ENCOUNTER — Encounter (HOSPITAL_COMMUNITY)
Admission: AD | Disposition: A | Discharge status: Home / Self Care | Payer: Self-pay | Source: Home / Self Care | Attending: Obstetrics & Gynecology

## 2019-01-09 ENCOUNTER — Encounter (HOSPITAL_COMMUNITY): Payer: Self-pay | Admitting: Obstetrics & Gynecology

## 2019-01-09 ENCOUNTER — Inpatient Hospital Stay (HOSPITAL_COMMUNITY): Payer: BC Managed Care – PPO

## 2019-01-09 DIAGNOSIS — O480 Post-term pregnancy: Secondary | ICD-10-CM

## 2019-01-09 DIAGNOSIS — Z3A40 40 weeks gestation of pregnancy: Secondary | ICD-10-CM

## 2019-01-09 HISTORY — PX: DILATION AND EVACUATION: SHX1459

## 2019-01-09 LAB — DIC (DISSEMINATED INTRAVASCULAR COAGULATION)PANEL
D-Dimer, Quant: 3.36 ug/mL-FEU — ABNORMAL HIGH (ref 0.00–0.50)
Fibrinogen: 372 mg/dL (ref 210–475)
INR: 1.1 (ref 0.8–1.2)
Platelets: 261 10*3/uL (ref 150–400)
Prothrombin Time: 14 seconds (ref 11.4–15.2)
Smear Review: NONE SEEN
aPTT: 28 seconds (ref 24–36)

## 2019-01-09 LAB — CBC
HCT: 29.5 % — ABNORMAL LOW (ref 36.0–46.0)
Hemoglobin: 9.6 g/dL — ABNORMAL LOW (ref 12.0–15.0)
MCH: 26.5 pg (ref 26.0–34.0)
MCHC: 32.5 g/dL (ref 30.0–36.0)
MCV: 81.5 fL (ref 80.0–100.0)
Platelets: 253 10*3/uL (ref 150–400)
RBC: 3.62 MIL/uL — ABNORMAL LOW (ref 3.87–5.11)
RDW: 15.9 % — ABNORMAL HIGH (ref 11.5–15.5)
WBC: 9.1 10*3/uL (ref 4.0–10.5)
nRBC: 0 % (ref 0.0–0.2)

## 2019-01-09 LAB — RPR: RPR Ser Ql: NONREACTIVE

## 2019-01-09 SURGERY — DILATION AND EVACUATION, UTERUS
Anesthesia: Epidural

## 2019-01-09 MED ORDER — MENTHOL 3 MG MT LOZG: 1.0000 | LOZENGE | OROMUCOSAL | Status: DC | PRN

## 2019-01-09 MED ORDER — ALBUMIN HUMAN 5 % IV SOLN
INTRAVENOUS | Status: AC
  Filled 2019-01-09: qty 250

## 2019-01-09 MED ORDER — LACTATED RINGERS IV SOLN: INTRAVENOUS | Status: DC

## 2019-01-09 MED ORDER — PHENYLEPHRINE HCL (PRESSORS) 10 MG/ML IV SOLN
INTRAVENOUS | Status: DC | PRN
  Administered 2019-01-09: 80 ug via INTRAVENOUS
  Administered 2019-01-09: 40 ug via INTRAVENOUS
  Administered 2019-01-09: 80 ug via INTRAVENOUS
  Administered 2019-01-09: 40 ug via INTRAVENOUS

## 2019-01-09 MED ORDER — FENTANYL-BUPIVACAINE-NACL 0.5-0.125-0.9 MG/250ML-% EP SOLN
12.0000 mL/h | EPIDURAL | Status: DC | PRN
  Filled 2019-01-09: qty 250

## 2019-01-09 MED ORDER — SIMETHICONE 80 MG PO CHEW: 80.0000 mg | CHEWABLE_TABLET | ORAL | Status: DC | PRN

## 2019-01-09 MED ORDER — GABAPENTIN 100 MG PO CAPS
300.0000 mg | ORAL_CAPSULE | Freq: Two times a day (BID) | ORAL | Status: DC
  Administered 2019-01-10 – 2019-01-11 (×4): 300 mg via ORAL
  Filled 2019-01-09 (×5): qty 3

## 2019-01-09 MED ORDER — EPHEDRINE 5 MG/ML INJ
10.0000 mg | INTRAVENOUS | Status: DC | PRN
  Filled 2019-01-09: qty 10

## 2019-01-09 MED ORDER — WITCH HAZEL-GLYCERIN EX PADS: 1.0000 "application " | MEDICATED_PAD | CUTANEOUS | Status: DC | PRN

## 2019-01-09 MED ORDER — DIBUCAINE (PERIANAL) 1 % EX OINT: 1.0000 "application " | TOPICAL_OINTMENT | CUTANEOUS | Status: DC | PRN

## 2019-01-09 MED ORDER — PHENYLEPHRINE 40 MCG/ML (10ML) SYRINGE FOR IV PUSH (FOR BLOOD PRESSURE SUPPORT)
PREFILLED_SYRINGE | INTRAVENOUS | Status: AC
  Filled 2019-01-09: qty 10

## 2019-01-09 MED ORDER — ONDANSETRON HCL 4 MG/2ML IJ SOLN
INTRAMUSCULAR | Status: DC | PRN
  Administered 2019-01-09: 4 mg via INTRAVENOUS

## 2019-01-09 MED ORDER — ENOXAPARIN SODIUM 60 MG/0.6ML ~~LOC~~ SOLN
60.0000 mg | SUBCUTANEOUS | Status: DC
  Administered 2019-01-10 – 2019-01-11 (×2): 60 mg via SUBCUTANEOUS
  Filled 2019-01-09 (×2): qty 0.6

## 2019-01-09 MED ORDER — TRAMADOL HCL 50 MG PO TABS: 50.0000 mg | ORAL_TABLET | Freq: Four times a day (QID) | ORAL | Status: DC | PRN

## 2019-01-09 MED ORDER — OXYTOCIN 40 UNITS IN NORMAL SALINE INFUSION - SIMPLE MED
INTRAVENOUS | Status: AC
  Filled 2019-01-09: qty 1000

## 2019-01-09 MED ORDER — MAGNESIUM HYDROXIDE 400 MG/5ML PO SUSP: 30.0000 mL | ORAL | Status: DC | PRN

## 2019-01-09 MED ORDER — FENTANYL CITRATE (PF) 100 MCG/2ML IJ SOLN
INTRAMUSCULAR | Status: AC
  Filled 2019-01-09: qty 2

## 2019-01-09 MED ORDER — BENZOCAINE-MENTHOL 20-0.5 % EX AERO
1.0000 "application " | INHALATION_SPRAY | CUTANEOUS | Status: DC | PRN
  Administered 2019-01-11: 1 via TOPICAL
  Filled 2019-01-09: qty 56

## 2019-01-09 MED ORDER — PRENATAL MULTIVITAMIN CH
1.0000 | ORAL_TABLET | Freq: Every day | ORAL | Status: DC
  Administered 2019-01-09 – 2019-01-10 (×2): 1 via ORAL
  Filled 2019-01-09 (×2): qty 1

## 2019-01-09 MED ORDER — DIPHENHYDRAMINE HCL 50 MG/ML IJ SOLN: 12.5000 mg | INTRAMUSCULAR | Status: DC | PRN

## 2019-01-09 MED ORDER — METHYLERGONOVINE MALEATE 0.2 MG/ML IJ SOLN
INTRAMUSCULAR | Status: DC | PRN
  Administered 2019-01-09: 0.2 mg via INTRAMUSCULAR

## 2019-01-09 MED ORDER — FENTANYL CITRATE (PF) 100 MCG/2ML IJ SOLN
INTRAMUSCULAR | Status: DC | PRN
  Administered 2019-01-09: 100 ug via EPIDURAL

## 2019-01-09 MED ORDER — LIDOCAINE HCL (PF) 1 % IJ SOLN
INTRAMUSCULAR | Status: DC | PRN
  Administered 2019-01-09 (×2): 5 mL via EPIDURAL

## 2019-01-09 MED ORDER — HYDROMORPHONE HCL 1 MG/ML IJ SOLN: 1.0000 mg | INTRAMUSCULAR | Status: DC | PRN

## 2019-01-09 MED ORDER — DEXAMETHASONE SODIUM PHOSPHATE 10 MG/ML IJ SOLN
INTRAMUSCULAR | Status: DC | PRN
  Administered 2019-01-09: 10 mg via INTRAVENOUS

## 2019-01-09 MED ORDER — COCONUT OIL OIL: 1.0000 "application " | TOPICAL_OIL | Status: DC | PRN

## 2019-01-09 MED ORDER — OXYTOCIN 40 UNITS IN NORMAL SALINE INFUSION - SIMPLE MED: 2.5000 [IU]/h | INTRAVENOUS | Status: AC

## 2019-01-09 MED ORDER — SIMETHICONE 80 MG PO CHEW
80.0000 mg | CHEWABLE_TABLET | ORAL | Status: DC
  Administered 2019-01-10 (×2): 80 mg via ORAL
  Filled 2019-01-09 (×2): qty 1

## 2019-01-09 MED ORDER — METHYLERGONOVINE MALEATE 0.2 MG/ML IJ SOLN
INTRAMUSCULAR | Status: AC
  Filled 2019-01-09: qty 1

## 2019-01-09 MED ORDER — PHENYLEPHRINE 40 MCG/ML (10ML) SYRINGE FOR IV PUSH (FOR BLOOD PRESSURE SUPPORT)
80.0000 ug | PREFILLED_SYRINGE | INTRAVENOUS | Status: AC | PRN
  Administered 2019-01-09 (×3): 80 ug via INTRAVENOUS

## 2019-01-09 MED ORDER — DEXAMETHASONE SODIUM PHOSPHATE 10 MG/ML IJ SOLN
INTRAMUSCULAR | Status: AC
  Filled 2019-01-09: qty 1

## 2019-01-09 MED ORDER — MEASLES, MUMPS & RUBELLA VAC IJ SOLR: 0.5000 mL | Freq: Once | INTRAMUSCULAR | Status: DC

## 2019-01-09 MED ORDER — SODIUM CHLORIDE 0.9 % IV SOLN
INTRAVENOUS | Status: DC | PRN
  Administered 2019-01-09: 08:00:00 via INTRAVENOUS

## 2019-01-09 MED ORDER — ONDANSETRON HCL 4 MG/2ML IJ SOLN
INTRAMUSCULAR | Status: AC
  Filled 2019-01-09: qty 2

## 2019-01-09 MED ORDER — TETANUS-DIPHTH-ACELL PERTUSSIS 5-2.5-18.5 LF-MCG/0.5 IM SUSP: 0.5000 mL | Freq: Once | INTRAMUSCULAR | Status: DC

## 2019-01-09 MED ORDER — ALBUMIN HUMAN 5 % IV SOLN
12.5000 g | Freq: Once | INTRAVENOUS | Status: AC
  Administered 2019-01-09 (×2): via INTRAVENOUS

## 2019-01-09 MED ORDER — OXYCODONE-ACETAMINOPHEN 5-325 MG PO TABS: 2.0000 | ORAL_TABLET | ORAL | Status: DC | PRN

## 2019-01-09 MED ORDER — EPHEDRINE 5 MG/ML INJ: 10.0000 mg | INTRAVENOUS | Status: DC | PRN

## 2019-01-09 MED ORDER — PHENYLEPHRINE 40 MCG/ML (10ML) SYRINGE FOR IV PUSH (FOR BLOOD PRESSURE SUPPORT)
80.0000 ug | PREFILLED_SYRINGE | INTRAVENOUS | Status: DC | PRN
  Filled 2019-01-09 (×2): qty 10

## 2019-01-09 MED ORDER — IBUPROFEN 600 MG PO TABS
600.0000 mg | ORAL_TABLET | Freq: Four times a day (QID) | ORAL | Status: DC
  Administered 2019-01-09 – 2019-01-11 (×8): 600 mg via ORAL
  Filled 2019-01-09 (×8): qty 1

## 2019-01-09 MED ORDER — NITROGLYCERIN 0.4 MG/SPRAY TL SOLN
1.0000 | Status: DC | PRN
  Administered 2019-01-09: 07:00:00 2 via SUBLINGUAL
  Filled 2019-01-09: qty 12

## 2019-01-09 MED ORDER — NITROGLYCERIN 0.4 MG SL SUBL
SUBLINGUAL_TABLET | SUBLINGUAL | Status: AC
  Filled 2019-01-09: qty 1

## 2019-01-09 MED ORDER — CHLOROPROCAINE HCL (PF) 3 % IJ SOLN
INTRAMUSCULAR | Status: AC
  Filled 2019-01-09: qty 20

## 2019-01-09 MED ORDER — ONDANSETRON HCL 4 MG PO TABS: 4.0000 mg | ORAL_TABLET | ORAL | Status: DC | PRN

## 2019-01-09 MED ORDER — DIPHENHYDRAMINE HCL 25 MG PO CAPS: 25.0000 mg | ORAL_CAPSULE | Freq: Four times a day (QID) | ORAL | Status: DC | PRN

## 2019-01-09 MED ORDER — OXYCODONE HCL 5 MG PO TABS: 5.0000 mg | ORAL_TABLET | ORAL | Status: DC | PRN

## 2019-01-09 MED ORDER — SENNOSIDES-DOCUSATE SODIUM 8.6-50 MG PO TABS
2.0000 | ORAL_TABLET | ORAL | Status: DC
  Administered 2019-01-10 (×2): 2 via ORAL
  Filled 2019-01-09 (×2): qty 2

## 2019-01-09 MED ORDER — OXYTOCIN 40 UNITS IN NORMAL SALINE INFUSION - SIMPLE MED
INTRAVENOUS | Status: DC | PRN
  Administered 2019-01-09: 100 mL via INTRAVENOUS
  Administered 2019-01-09: 200 mL via INTRAVENOUS

## 2019-01-09 MED ORDER — LACTATED RINGERS IV SOLN
500.0000 mL | Freq: Once | INTRAVENOUS | Status: AC
  Administered 2019-01-09: 08:00:00 via INTRAVENOUS

## 2019-01-09 MED ORDER — PIPERACILLIN-TAZOBACTAM 3.375 G IVPB
3.3750 g | Freq: Three times a day (TID) | INTRAVENOUS | Status: AC
  Administered 2019-01-09 – 2019-01-10 (×3): 3.375 g via INTRAVENOUS
  Filled 2019-01-09 (×4): qty 50

## 2019-01-09 MED ORDER — LIDOCAINE-EPINEPHRINE (PF) 2 %-1:200000 IJ SOLN
INTRAMUSCULAR | Status: AC
  Filled 2019-01-09: qty 10

## 2019-01-09 MED ORDER — ACETAMINOPHEN 325 MG PO TABS
650.0000 mg | ORAL_TABLET | ORAL | Status: DC | PRN
  Administered 2019-01-09: 12:00:00 650 mg via ORAL
  Filled 2019-01-09: qty 2

## 2019-01-09 MED ORDER — NITROGLYCERIN 0.4 MG/SPRAY TL SOLN
Status: AC
  Filled 2019-01-09: qty 4.9

## 2019-01-09 MED ORDER — LIDOCAINE-EPINEPHRINE (PF) 2 %-1:200000 IJ SOLN
INTRAMUSCULAR | Status: DC | PRN
  Administered 2019-01-09: 2 mL via INTRADERMAL
  Administered 2019-01-09: 3 mL via INTRADERMAL
  Administered 2019-01-09: 5 mL via INTRADERMAL

## 2019-01-09 MED ORDER — SODIUM CHLORIDE (PF) 0.9 % IJ SOLN
INTRAMUSCULAR | Status: DC | PRN
  Administered 2019-01-09: 12 mL/h via EPIDURAL

## 2019-01-09 MED ORDER — FERROUS SULFATE 325 (65 FE) MG PO TABS
325.0000 mg | ORAL_TABLET | Freq: Two times a day (BID) | ORAL | Status: DC
  Administered 2019-01-09 – 2019-01-11 (×4): 325 mg via ORAL
  Filled 2019-01-09 (×4): qty 1

## 2019-01-09 MED ORDER — ONDANSETRON HCL 4 MG/2ML IJ SOLN: 4.0000 mg | INTRAMUSCULAR | Status: DC | PRN

## 2019-01-09 SURGICAL SUPPLY — 22 items
CATH ROBINSON RED A/P 16FR (CATHETERS) ×3 IMPLANT
CLOTH BEACON ORANGE TIMEOUT ST (SAFETY) ×3 IMPLANT
DECANTER SPIKE VIAL GLASS SM (MISCELLANEOUS) ×3 IMPLANT
GLOVE BIOGEL PI IND STRL 7.0 (GLOVE) ×3 IMPLANT
GLOVE BIOGEL PI INDICATOR 7.0 (GLOVE) ×6
GLOVE ECLIPSE 7.0 STRL STRAW (GLOVE) ×3 IMPLANT
GOWN STRL REUS W/TWL LRG LVL3 (GOWN DISPOSABLE) ×6 IMPLANT
KIT BERKELEY 1ST TRIMESTER 3/8 (MISCELLANEOUS) ×3 IMPLANT
NS IRRIG 1000ML POUR BTL (IV SOLUTION) ×3 IMPLANT
PACK VAGINAL MINOR WOMEN LF (CUSTOM PROCEDURE TRAY) ×3 IMPLANT
PAD OB MATERNITY 4.3X12.25 (PERSONAL CARE ITEMS) ×3 IMPLANT
PAD PREP 24X48 CUFFED NSTRL (MISCELLANEOUS) ×3 IMPLANT
SET BERKELEY SUCTION TUBING (SUCTIONS) ×3 IMPLANT
SUT VIC AB 2-0 CT1 27 (SUTURE) ×2
SUT VIC AB 2-0 CT1 TAPERPNT 27 (SUTURE) IMPLANT
TOWEL OR 17X24 6PK STRL BLUE (TOWEL DISPOSABLE) ×6 IMPLANT
VACURETTE 10 RIGID CVD (CANNULA) IMPLANT
VACURETTE 16MM ASPIR CVD .5 (CANNULA) ×2 IMPLANT
VACURETTE 6 ASPIR F TIP BERK (CANNULA) IMPLANT
VACURETTE 7MM CVD STRL WRAP (CANNULA) IMPLANT
VACURETTE 8 RIGID CVD (CANNULA) IMPLANT
VACURETTE 9 RIGID CVD (CANNULA) IMPLANT

## 2019-01-09 NOTE — Progress Notes (Signed)
Prior to going to OR, patient had hypotensive episode with BP in 60s/40s.  Tow doses of Neo given, Anesthesia at bedside. Bleeding stable.  IV fluid bolus is ongoing. Patient is still able to hold a conversation.  Albumin ordered for infusion; labs also ordered.  BP improved to 80s/60s (closer to baseline), will go to OR now.  Verita Schneiders, MD, [place] for [employer], [place]

## 2019-01-09 NOTE — Transfer of Care (Addendum)
Immediate Anesthesia Transfer of Care Note  Patient: Danielle Cisneros  Procedure(s) Performed: DILATATION AND EVACUATION (N/A )  Patient Location: PACU  Anesthesia Type:Epidural  Level of Consciousness: awake and patient cooperative  Airway & Oxygen Therapy: Patient Spontanous Breathing  Post-op Assessment: Report given to RN and Post -op Vital signs reviewed and stable  Post vital signs: Reviewed and stable  Last Vitals:  Vitals Value Taken Time  BP 78/48 01/09/2019 0848  Temp 98.7 01/09/2019 0852  Pulse 88 01/09/19 0846  Resp 23 01/09/19 0846  SpO2 97 % 01/09/19 0846  Vitals shown include unvalidated device data.  Last Pain:  Vitals:   01/09/19 0645  TempSrc: Oral  PainSc:          Complications: No apparent anesthesia complications

## 2019-01-09 NOTE — Anesthesia Postprocedure Evaluation (Signed)
Anesthesia Post Note  Patient: Danielle Cisneros  Procedure(s) Performed: DILATATION AND EVACUATION (N/A )     Patient location during evaluation: PACU Anesthesia Type: Epidural Level of consciousness: awake and alert Pain management: pain level controlled Vital Signs Assessment: post-procedure vital signs reviewed and stable Respiratory status: spontaneous breathing and respiratory function stable Cardiovascular status: blood pressure returned to baseline and stable Postop Assessment: spinal receding Anesthetic complications: no    Last Vitals:  Vitals:   01/09/19 0900 01/09/19 0915  BP: (!) 94/44 (!) 82/40  Pulse: 84 73  Resp: 18 15  Temp:    SpO2: 98% 100%    Last Pain:  Vitals:   01/09/19 0645  TempSrc: Oral  PainSc:    Pain Goal:                Epidural/Spinal Function Cutaneous sensation: Tingles (01/09/19 0915), Patient able to flex knees: Yes (01/09/19 0915), Patient able to lift hips off bed: No (01/09/19 0915), Back pain beyond tenderness at insertion site: No (01/09/19 0915), Progressively worsening motor and/or sensory loss: No (01/09/19 0915), Bowel and/or bladder incontinence post epidural: No (01/09/19 0915)  Damian Hofstra [name]

## 2019-01-09 NOTE — Addendum Note (Signed)
Addendum  created 01/09/19 1750 by Vista Deck, CRNA   Charge Capture section accepted

## 2019-01-09 NOTE — Anesthesia Preprocedure Evaluation (Signed)
Anesthesia Evaluation  Patient identified by MRN, date of birth, ID band Patient awake    Reviewed: Allergy & Precautions, H&P , NPO status , Patient's Chart, lab work & pertinent test results  History of Anesthesia Complications Negative for: history of anesthetic complications  Airway Mallampati: II  TM Distance: >3 FB Neck ROM: full    Dental no notable dental hx. (+) Teeth Intact   Pulmonary neg pulmonary ROS,    Pulmonary exam normal breath sounds clear to auscultation       Cardiovascular negative cardio ROS Normal cardiovascular exam Rhythm:regular Rate:Normal     Neuro/Psych negative neurological ROS  negative psych ROS   GI/Hepatic negative GI ROS, Neg liver ROS,   Endo/Other  Morbid obesity  Renal/GU negative Renal ROS  negative genitourinary   Musculoskeletal   Abdominal   Peds  Hematology negative hematology ROS (+)   Anesthesia Other Findings   Reproductive/Obstetrics (+) Pregnancy                             Anesthesia Physical  Anesthesia Plan  ASA: III  Anesthesia Plan: Epidural   Post-op Pain Management:    Induction:   PONV Risk Score and Plan:   Airway Management Planned:   Additional Equipment:   Intra-op Plan:   Post-operative Plan:   Informed Consent: I have reviewed the patients History and Physical, chart, labs and discussed the procedure including the risks, benefits and alternatives for the proposed anesthesia with the patient or authorized representative who has indicated his/her understanding and acceptance.       Plan Discussed with: CRNA and Anesthesiologist  Anesthesia Plan Comments:         Anesthesia Quick Evaluation

## 2019-01-09 NOTE — Anesthesia Postprocedure Evaluation (Addendum)
Anesthesia Post Note  Patient: Danielle Cisneros  Procedure(s) Performed: AN AD HOC LABOR EPIDURAL     Patient location during evaluation: PACU Anesthesia Type: Epidural Level of consciousness: awake and alert Pain management: pain level controlled Vital Signs Assessment: post-procedure vital signs reviewed and stable Respiratory status: spontaneous breathing, nonlabored ventilation and respiratory function stable Cardiovascular status: stable Postop Assessment: no headache, no backache and epidural receding Anesthetic complications: no    Last Vitals:  Vitals:   01/09/19 0900 01/09/19 0915  BP: (!) 94/44 (!) 82/40  Pulse: 84 73  Resp: 18 15  Temp:    SpO2: 98% 100%    Last Pain:  Vitals:   01/09/19 0645  TempSrc: Oral  PainSc:    Pain Goal:                Epidural/Spinal Function Cutaneous sensation: Tingles (01/09/19 0915), Patient able to flex knees: Yes (01/09/19 0915), Patient able to lift hips off bed: No (01/09/19 0915), Back pain beyond tenderness at insertion site: No (01/09/19 0915), Progressively worsening motor and/or sensory loss: No (01/09/19 0915), Bowel and/or bladder incontinence post epidural: No (01/09/19 0915)  [name]

## 2019-01-09 NOTE — Lactation Note (Signed)
This note was copied from a baby's chart. Lactation Consultation Note  Patient Name: Danielle Cisneros Today's Date: 01/09/2019 Reason for consult: Initial assessment;Term(EBL 2,700 ml) G2P3 delivery at 40 weeks 6 days gestation.  Attempted to see several times and mom on telephone.  Mom still on phone  But asked her sister to hold on.  I discussed with mom importance of doing some extra stimulation with breastfeeding due to blood loss.  Discussed either pumping or hand expressing past breastfeeds. Mom in agreement.  Says she would like a pump kit.  Has used a DEBP before. Urged to pump/ hand express past breastfeeds and feed back any breastmilk that she gets via spoon.  Call lactation as needed.  Maternal Data Does the patient have breastfeeding experience prior to this delivery?: Yes  Feeding Feeding Type: Breast Fed  LATCH Score                   Interventions Interventions: Breast feeding basics reviewed  Lactation Tools Discussed/Used     Consult Status Consult Status: Follow-up Date: 01/10/19 Follow-up type: In-patient    [facility] Caul 01/09/2019, 6:41 PM

## 2019-01-09 NOTE — Anesthesia Preprocedure Evaluation (Signed)
Anesthesia Evaluation  Patient identified by MRN, date of birth, ID band Patient awake    Reviewed: Allergy & Precautions, H&P , NPO status , Patient's Chart, lab work & pertinent test results  History of Anesthesia Complications Negative for: history of anesthetic complications  Airway Mallampati: II  TM Distance: >3 FB Neck ROM: full    Dental no notable dental hx. (+) Teeth Intact   Pulmonary neg pulmonary ROS,    Pulmonary exam normal breath sounds clear to auscultation       Cardiovascular negative cardio ROS Normal cardiovascular exam Rhythm:regular Rate:Normal     Neuro/Psych negative neurological ROS  negative psych ROS   GI/Hepatic negative GI ROS, Neg liver ROS,   Endo/Other  Morbid obesity  Renal/GU negative Renal ROS  negative genitourinary   Musculoskeletal   Abdominal   Peds  Hematology negative hematology ROS (+)   Anesthesia Other Findings   Reproductive/Obstetrics (+) Pregnancy                             Anesthesia Physical Anesthesia Plan  ASA: III  Anesthesia Plan: Epidural   Post-op Pain Management:    Induction:   PONV Risk Score and Plan:   Airway Management Planned:   Additional Equipment:   Intra-op Plan:   Post-operative Plan:   Informed Consent: I have reviewed the patients History and Physical, chart, labs and discussed the procedure including the risks, benefits and alternatives for the proposed anesthesia with the patient or authorized representative who has indicated his/her understanding and acceptance.       Plan Discussed with:   Anesthesia Plan Comments:         Anesthesia Quick Evaluation

## 2019-01-09 NOTE — Progress Notes (Signed)
Patient fundal assessment noted as left, 2 above. Also with difficulty taking BP on upper arm, forearm used instead for BP reading of 96/54.  Heather with [facility] aware of fundal assessment and BP, says it is OK to use forearm given patient BMI.

## 2019-01-09 NOTE — Progress Notes (Signed)
Faculty Practice OB/GYN Attending Note  Subjective:  Called to evaluate patient with retained placenta after delivery 30 minutes ago. EBL 900 ml so far, patient mentating well. Epidural analgesia on board, but seems to be not working well.    Objective:  Blood pressure 117/63, pulse (!) 111, temperature 99.3 F (37.4 C), temperature source Oral, resp. rate 18, height 5\' 2"  (1.575 m), weight 120.2 kg, last menstrual period 03/10/2018, SpO2 100 %, currently breastfeeding. Gen: NAD HENT: Normocephalic, atraumatic Lungs: Normal respiratory effort Heart: Regular rate noted Abdomen: firm fundus, soft, NT Pelvic:  Placenta unable to be fully palpated totally due fibroid obstruction.  Received 2 doses of SL nitroglycerin to help relax her uterus, unable to get past the physical obstruction and palpate the placenta fully. Patient also very uncomfortable during procedure.  Procedure stopped after two failed attempts of manual extraction at the bedside.   Assessment & Plan:  Retained placenta s/p delivery after two failed manual extraction attempts.  Curettage in OR recommended, under ultrasound guidance. Risks of curettage reviewed, risks of surgery including further bleeding needing transfusion, infection, injury to surrounding organs, need for additional procedures, possibility of intrauterine scarring which may impair future fertility, risk of retained products which may require further management and other postoperative/anesthesia complications were explained to patient.  Likelihood of success of complete evacuation of the uterus was discussed with the patient.  Written informed consent was obtained.   Anesthesia and OR aware.  Preoperative prophylactic Zosyn has been ordered and is on call to the OR.  To OR when ready.    Verita Schneiders, MD, [place] for [employer], [place]

## 2019-01-09 NOTE — Anesthesia Procedure Notes (Signed)
Epidural Patient location during procedure: OB  Staffing Anesthesiologist: Monico Sudduth, MD Performed: anesthesiologist   Preanesthetic Checklist Completed: patient identified, site marked, surgical consent, pre-op evaluation, timeout performed, IV checked, risks and benefits discussed and monitors and equipment checked  Epidural Patient position: sitting Prep: DuraPrep Patient monitoring: heart rate, continuous pulse ox and blood pressure Approach: right paramedian Location: L3-L4 Injection technique: LOR saline  Needle:  Needle type: Tuohy  Needle gauge: 17 G Needle length: 9 cm and 9 Needle insertion depth: 7 cm Catheter type: closed end flexible Catheter size: 20 Guage Catheter at skin depth: 11 cm Test dose: negative  Assessment Events: blood not aspirated, injection not painful, no injection resistance, negative IV test and no paresthesia  Additional Notes Patient identified. Risks/Benefits/Options discussed with patient including but not limited to bleeding, infection, nerve damage, paralysis, failed block, incomplete pain control, headache, blood pressure changes, nausea, vomiting, reactions to medication both or allergic, itching and postpartum back pain. Confirmed with bedside nurse the patient's most recent platelet count. Confirmed with patient that they are not currently taking any anticoagulation, have any bleeding history or any family history of bleeding disorders. Patient expressed understanding and wished to proceed. All questions were answered. Sterile technique was used throughout the entire procedure. Please see nursing notes for vital signs. Test dose was given through epidural needle and negative prior to continuing to dose epidural or start infusion. Warning signs of high block given to the patient including shortness of breath, tingling/numbness in hands, complete motor block, or any concerning symptoms with instructions to call for help. Patient was given  instructions on fall risk and not to get out of bed. All questions and concerns addressed with instructions to call with any issues.     

## 2019-01-09 NOTE — Progress Notes (Addendum)
LABOR PROGRESS NOTE  Danielle Cisneros is a 28 y.o. [id number] at [redacted]w[redacted]d  admitted for IOL for postdates  Subjective: Patient beginning to feel more adequate contractions. Relieved with IV pain meds.  Objective: BP (!) 97/50   Pulse 70   Temp 98.8 F (37.1 C) (Oral)   Resp 18   Ht 5\' 2"  (1.575 m)   Wt 120.2 kg   LMP 03/10/2018 (Approximate)   BMI 48.47 kg/m  or  Vitals:   01/08/19 2205 01/08/19 2331 01/08/19 2334 01/09/19 0000  BP: (!) 86/39  (!) 110/56 (!) 97/50  Pulse: 80  80 70  Resp: 18  18 18   Temp:  98.8 F (37.1 C)    TempSrc:  Oral    Weight:      Height:       Dilation: 5 Effacement (%): 80 Cervical Position: Posterior Station: -2 Presentation: Vertex Exam by:: Dr Tarry Kos  FHT: baseline rate 120-125, moderate varibility, + acel, no decel Toco: 1-5 mins  Labs: Lab Results  Component Value Date   WBC 9.6 01/08/2019   HGB 11.3 (L) 01/08/2019   HCT 35.9 (L) 01/08/2019   MCV 81.4 01/08/2019   PLT 284 01/08/2019    Patient Active Problem List   Diagnosis Date Noted  . Post-dates pregnancy 01/08/2019  . Uterine fibroid complicating antenatal care, baby not yet delivered in second trimester 06/04/2016  . Rh negative state in antepartum period 04/13/2016  . Morbid obesity ([place]) 07/17/2015    Assessment / Plan: 28 y.o. [id number] at [redacted]w[redacted]d here for IOL for postdates  Labor: Progressing well with Pitocin. Will continue Pit and plan to reassess at 0400, sooner if indicated by patient. Consider AROM if progresses further. Fetal Wellbeing:  Cat I Pain Control:  IV pain meds, epidural upon request Anticipated MOD:  NSVD  Mina Marble, D.O. [place], PGY2 01/09/2019, 12:19 AM

## 2019-01-09 NOTE — Op Note (Signed)
Danielle Cisneros PROCEDURE DATE:  01/09/2019  PREOPERATIVE DIAGNOSES: Retained placenta status post vaginal delivery and associated postpartum hemorrhage; failed manual extraction attempts POSTOPERATIVE DIAGNOSES: The same PROCEDURE:   Postpartum Curettage under ultrasound guidance SURGEON:  Dr. Verita Schneiders ASSISTANT: Dr. Clayton Lefort  INDICATIONS: 28 y.o.  [id number] with retained placenta and postpartum hemorrhage after SVD at [redacted]w[redacted]d, needing surgical completion.  Risks of surgery were discussed with the patient and her husband including but not limited to: bleeding which may require transfusion; infection which may require antibiotics; injury to uterus or surrounding organs; need for additional procedures including laparotomy or laparoscopy; possibility of intrauterine scarring which may impair future fertility; risk of retained products which may require further management  and other postoperative/anesthesia complications. Written informed consent was obtained.  FINDINGS:  Left sided fundal/cornual placenta with significant clots and blood.  Total EBL about 2.5 liters including delivery blood loss.    ANESTHESIA:    Epidural INTRAVENOUS FLUIDS: 2100 ml of LR, 500 ml of albumin, 2 pRBCs ESTIMATED BLOOD LOSS:  2700 ml including delivery blood loss. SPECIMENS:  Placental fragments sent to pathology COMPLICATIONS:  None immediate.  PROCEDURE DETAILS:  The patient was then taken to the operating room where her epidural anesthesia was dosed up to a surgical level and found to be adequate.  After an adequate timeout was performed, she was placed in the dorsal lithotomy position and examined; then prepped and draped in the sterile manner.   A vaginal speculum was then placed in the patient's vagina and a ring forceps was applied to the anterior lip of the cervix.  The cervix was already about 4 cm dilated; forceps were advanced into the uterus to grasp and remove the placenta in fragments under  ultrasound guidance.  A 16 mm suction curette was then advanced into the uterus, the suction device was then activated and curette slowly rotated to clear further placental fragments. This did not yield much placental tissue given location of the placenta, noted to be in far left corner of the fundus and was hard to reach. The placenta was also noted to be rather adherent to this corner.  A sharp curettage was attempted and was able to disrupt the placenta further.  Manual extraction was then reattempted once the placenta was disrupted with the sharp curettage, and the placenta was finally able to be delivered.  Further suction and sharp curettage was performed to confirm complete emptying of the uterus; this was evidenced on ultrasound examination. There was significant bleeding noted during procedure so IM Methergine and IV Pitocin was administered.   The bleeding was noted to subside significantly.  A second degree perineal laceration was repaired in the usual fashion using 2-0 vicryl.  All instruments were removed from the patient's vagina.  Sponge and instrument counts were correct times three.  The patient tolerated the procedure well and was taken to the recovery area awake and in stable condition.   Verita Schneiders, MD, [place] for [employer], [place]

## 2019-01-09 NOTE — Progress Notes (Signed)
LABOR PROGRESS NOTE  Danielle Cisneros is a 28 y.o. [id number] at [redacted]w[redacted]d  admitted for IOL for postdates.  Subjective: Patient notes feeling more pressure in her bottom and contractions are becoming stronger. Tolerating epidural. Denies any concerns or complaints.  Objective: BP (!) 87/64   Pulse (!) 107   Temp 99.3 F (37.4 C) (Oral)   Resp 18   Ht 5\' 2"  (1.575 m)   Wt 120.2 kg   LMP 03/10/2018 (Approximate)   SpO2 100%   BMI 48.47 kg/m  or  Vitals:   01/09/19 0321 01/09/19 0335 01/09/19 0400 01/09/19 0435  BP:  (!) 77/31 96/64 (!) 87/64  Pulse:  78 (!) 116 (!) 107  Resp:  18 18 18   Temp: 99.3 F (37.4 C)     TempSrc: Oral     SpO2:      Weight:      Height:       Dilation: Lip/rim Effacement (%): 100 Cervical Position: Posterior Station: 0 Presentation: Vertex Exam by:: amber pope, rn  FHT: baseline rate 135, moderate varibility, + acel, no decel Toco: q 1-3 mins  Labs: Lab Results  Component Value Date   WBC 9.6 01/08/2019   HGB 11.3 (L) 01/08/2019   HCT 35.9 (L) 01/08/2019   MCV 81.4 01/08/2019   PLT 284 01/08/2019    Patient Active Problem List   Diagnosis Date Noted  . Post-dates pregnancy 01/08/2019  . Uterine fibroid complicating antenatal care, baby not yet delivered in second trimester 06/04/2016  . Rh negative state in antepartum period 04/13/2016  . Morbid obesity ([place]) 07/17/2015    Assessment / Plan: 28 y.o. [id number] at [redacted]w[redacted]d here for IOL for postdates.  Labor: Difficult to assess cervical dilation initially due to large bulging bag. AROM at 0439 with clear fluid. Patient appears to be fully dilated with small anterior rim. Head engaged, zero station. Continue Pitocin at this time. Position changes as needed. Hopeful for continued progression and delivery soon.  Fetal Wellbeing:  Cat I Pain Control:  Epidural in place Anticipated MOD:  NSVD  Mina Marble, D.O. [place], PGY2 01/09/2019, 4:56 AM

## 2019-01-09 NOTE — Discharge Summary (Signed)
Postpartum Discharge Summary  Date of Service updated10/11/2018     Patient Name: Danielle Cisneros DOB: [birth year] MRN: [id number]  Date of admission: 01/08/2019 Delivering Provider: Clarnce Flock   Date of discharge: 01/11/2019  Admitting diagnosis: PREG Intrauterine pregnancy: [redacted]w[redacted]d    Secondary diagnosis:  Principal Problem:   Retained placenta without hemorrhage, postpartum Active Problems:   Morbid obesity ([place]   Rh negative state in antepartum period   Uterine fibroid during pregnancy, delivered   Post-dates pregnancy   Postpartum hemorrhage   Postpartum care following vaginal delivery  Additional problems: none     Discharge diagnosis: Term Pregnancy Delivered and PPH                                                                                                Post partum procedures:blood transfusion, curettage  Augmentation: AROM, Pitocin and Foley Balloon  Complications: [id number] [facility]:  Induction of Labor With Vaginal Delivery   28y.o. yo [id number] admitted to the hospital 01/08/2019 for induction of labor.  Indication for induction: Postdates.  Patient had an uncomplicated labor course as follows: Arrived at 1.5cm and had foley bulb placed, after which she was augmented with pitocin and AROM. Membrane Rupture Time/Date: 4:39 AM ,01/09/2019   Intrapartum Procedures: Episiotomy: None [1]                                         Lacerations:  2nd degree [3]  Patient had delivery of a Viable infant.  Information for the patient's newborn:  GaJaquelinne, [place]0[[id number]]Delivery Method: Vaginal, Spontaneous(Filed from Delivery Summary)    01/09/2019  Details of delivery can be found in separate delivery note. Delivery was complicated by retained placenta likely secondary to large fibroids. Manual extraction attempted x2 without success and she was taken to OR for successful D&C. Patient also had  OBH with EBL of 1758 cc, for which she was given 2u pRBC in the OR.  Patient had a routine postpartum course. Patient is discharged home 01/11/2019. Delivery time: 6:24 AM    Magnesium Sulfate received: No BMZ received: No Rhophylac:Yes MMR:N/A Transfusion:Yes  Physical exam  Vitals:   01/10/19 0525 01/10/19 1409 01/10/19 2100 01/11/19 0550  BP: 114/65 (!) 94/47 (!) 110/59 (!) 100/57  Pulse: 97 70 81 78  Resp: 16 18 18 18   Temp: 98.6 F (37 C) 98.6 F (37 C) 98.7 F (37.1 C) 98.5 F (36.9 C)  TempSrc: Oral Oral Oral Oral  SpO2: 100%  100% 100%  Weight:      Height:       General: alert, cooperative and no distress Lochia: appropriate Uterine Fundus: firm Incision: N/A DVT Evaluation: No evidence of DVT seen on physical exam. Labs: Lab Results  Component Value Date   WBC 23.9 (H) 01/10/2019   HGB 9.3 (L) 01/10/2019   HCT 28.2 (L) 01/10/2019   MCV 83.7 01/10/2019   PLT 229 01/10/2019  CMP Latest Ref Rng & Units 01/10/2019  Glucose 65 - 99 mg/dL -  BUN 6 - 20 mg/dL -  Creatinine 0.44 - 1.00 mg/dL 0.70  Sodium 134 - 144 mmol/L -  Potassium 3.5 - 5.2 mmol/L -  Chloride 96 - 106 mmol/L -  CO2 20 - 29 mmol/L -  Calcium 8.7 - 10.2 mg/dL -  Total Protein 6.0 - 8.5 g/dL -  Total Bilirubin 0.0 - 1.2 mg/dL -  Alkaline Phos 39 - 117 IU/L -  AST 0 - 40 IU/L -  ALT 0 - 32 IU/L -    Discharge instruction: per After Visit Summary and "Baby and Me Booklet".  After visit meds:  Allergies as of 01/11/2019   No Known Allergies     Medication List    TAKE these medications   ferrous sulfate 325 (65 FE) MG EC tablet Take 325 mg by mouth daily with breakfast.   ibuprofen 600 MG tablet Commonly known as: ADVIL Take 1 tablet (600 mg total) by mouth every 6 (six) hours.   Prenate Pixie 10-0.6-0.4-200 MG Caps Take 1 tablet by mouth daily.       Diet: routine diet  Activity: Advance as tolerated. Pelvic rest for 6 weeks.   Outpatient follow up:6 weeks Follow up  Appt:No future appointments. Follow up Visit: Follow-up Information    Department, [facility]. Schedule an appointment as soon as possible for a visit in 4 week(s).   Contact information: [place] [address] [phone]            Please schedule this patient for Postpartum visit in: 6 weeks with the following provider: Any provider For C/S patients schedule nurse incision check in weeks 2 weeks: no Low risk pregnancy complicated by: uterine fibroids, Rh negative status Delivery mode:  SVD Anticipated Birth Control:  IUD PP Procedures needed: none  Schedule Integrated BH visit: no      Newborn Data: Live born female  Birth Weight: 7 lb 7.8 oz (3395 g) APGAR: 9, 9  Newborn Delivery   Birth date/time: 01/09/2019 06:24:00 Delivery type: Vaginal, Spontaneous      Baby Feeding: Breast Disposition:home with mother   01/11/2019 Hansel Feinstein, CNM

## 2019-01-10 ENCOUNTER — Encounter (HOSPITAL_COMMUNITY): Payer: Self-pay | Admitting: *Deleted

## 2019-01-10 LAB — BPAM RBC
Blood Product Expiration Date: 202011082359
Blood Product Expiration Date: 202011082359
ISSUE DATE / TIME: 202010060728
ISSUE DATE / TIME: 202010060728
Unit Type and Rh: 9500
Unit Type and Rh: 9500

## 2019-01-10 LAB — CREATININE, SERUM
Creatinine, Ser: 0.7 mg/dL (ref 0.44–1.00)
GFR calc Af Amer: >60 mL/min (ref >60)
GFR calc non Af Amer: >60 mL/min (ref >60)

## 2019-01-10 LAB — TYPE AND SCREEN
ABO/RH(D): O NEG
Antibody Screen: POSITIVE
Unit division: 0
Unit division: 0

## 2019-01-10 LAB — CBC
HCT: 28.2 % — ABNORMAL LOW (ref 36.0–46.0)
Hemoglobin: 9.3 g/dL — ABNORMAL LOW (ref 12.0–15.0)
MCH: 27.6 pg (ref 26.0–34.0)
MCHC: 33 g/dL (ref 30.0–36.0)
MCV: 83.7 fL (ref 80.0–100.0)
Platelets: 229 10*3/uL (ref 150–400)
RBC: 3.37 MIL/uL — ABNORMAL LOW (ref 3.87–5.11)
RDW: 16.1 % — ABNORMAL HIGH (ref 11.5–15.5)
WBC: 23.9 10*3/uL — ABNORMAL HIGH (ref 4.0–10.5)
nRBC: 0 % (ref 0.0–0.2)

## 2019-01-10 LAB — SURGICAL PATHOLOGY

## 2019-01-10 NOTE — Lactation Note (Signed)
This note was copied from a baby's chart. Lactation Consultation Note  Patient Name: Danielle Cisneros [id number] Date: 01/10/2019 Reason for consult: Term Baby is 29 hours old/2% weight loss.  Mom reports that baby is feeding well and cluster fed during the night.  Reassured and instructed to continue to feed with any sign of hunger.  Baby is currently sleeping in crib.  Encouraged to call for assist prn.  Maternal Data    Feeding    LATCH Score                   Interventions    Lactation Tools Discussed/Used     Consult Status Consult Status: Follow-up Date: 01/11/19 Follow-up type: In-patient    Ave Filter 01/10/2019, 11:31 AM

## 2019-01-10 NOTE — Lactation Note (Signed)
This note was copied from a baby's chart. Lactation Consultation Note  Patient Name: Danielle Cisneros Today's Date: 01/10/2019   P3, 98 hour female infant. LC entered room mom and infant asleep at this time.   Maternal Data    Feeding Feeding Type: Breast Fed  LATCH Score                   Interventions    Lactation Tools Discussed/Used     Consult Status      [name] 01/10/2019, 2:58 AM

## 2019-01-10 NOTE — Progress Notes (Addendum)
Post Partum Day 1 Subjective: Feeling well, pain well-controlled. Minimal lochia. Tolerating a regular diet. Ambulating without difficulty.   Objective: Blood pressure (!) 94/47, pulse 70, temperature 98.6 F (37 C), temperature source Oral, resp. rate 18, height 5\' 2"  (1.575 m), weight 120.2 kg, last menstrual period 03/10/2018, SpO2 100 %, currently breastfeeding.  Physical Exam:  General: alert, cooperative, appears stated age and no distress Lochia: appropriate Uterine Fundus: firm DVT Evaluation: No evidence of DVT seen on physical exam.  Recent Labs    01/09/19 0725 01/10/19 0602  HGB 9.6* 9.3*  HCT 29.5* 28.2*    Assessment/Plan: Plan for discharge tomorrow. Okay to discharge mom today should baby be able to discharge; mom would like to stay as patient if baby stays. Breastfeeding; lactation assistance appreciated. Baby O neg; no rhogam indicated IUD to be placed outpatient  Ferrous sulfate initiated    LOS: 2 days   Danielle Cisneros N Elvis Boot 01/10/2019, 2:54 PM

## 2019-01-11 MED ORDER — IBUPROFEN 600 MG PO TABS: 600.0000 mg | ORAL_TABLET | Freq: Four times a day (QID) | ORAL | 0 refills | Status: AC

## 2019-01-11 NOTE — Lactation Note (Signed)
This note was copied from a baby's chart. Lactation Consultation Note  Patient Name: Danielle Cisneros [id number] Date: 01/11/2019 Reason for consult: Follow-up assessment Baby is 50 hours old/3% weight loss.  Mom reports that baby is feeding well.  She post pumps after some feeds and obtains 10 mls.  Discussed milk coming to volume and the prevention and treatment of engorgement.  Mom has a breast pump for home use.  Reviewed outpatient services and encouraged to call prn.  Maternal Data    Feeding Feeding Type: Breast Fed  LATCH Score                   Interventions    Lactation Tools Discussed/Used     Consult Status Consult Status: Complete Follow-up type: Call as needed    Ave Filter 01/11/2019, 9:16 AM

## 2019-01-11 NOTE — Discharge Instructions (Signed)

## 2021-07-15 ENCOUNTER — Telehealth: Payer: Self-pay | Admitting: Emergency Medicine

## 2021-07-15 NOTE — Telephone Encounter (Signed)
Order for breast pump faxed to [employer] with confirmation.  ?
